# Patient Record
Sex: Male | Born: 1965 | Race: White | Hispanic: No | Smoking: Current some day smoker
Health system: Southern US, Community
[De-identification: ages and names within clinical notes are randomized; demographics above are authoritative.]

## PROBLEM LIST (undated history)

## (undated) DIAGNOSIS — M1990 Unspecified osteoarthritis, unspecified site: Secondary | ICD-10-CM

## (undated) HISTORY — PX: NECK SURGERY: SHX720

---

## 2011-01-30 ENCOUNTER — Emergency Department (HOSPITAL_COMMUNITY)
Admission: EM | Admit: 2011-01-30 | Discharge: 2011-01-30 | Disposition: A | Discharge status: Home / Self Care | Payer: Self-pay | Attending: Emergency Medicine | Admitting: Emergency Medicine

## 2011-01-30 DIAGNOSIS — K089 Disorder of teeth and supporting structures, unspecified: Secondary | ICD-10-CM | POA: Insufficient documentation

## 2020-01-13 ENCOUNTER — Emergency Department: Payer: Self-pay

## 2020-01-13 ENCOUNTER — Encounter: Payer: Self-pay | Admitting: Emergency Medicine

## 2020-01-13 ENCOUNTER — Other Ambulatory Visit: Payer: Self-pay

## 2020-01-13 ENCOUNTER — Emergency Department
Admission: EM | Admit: 2020-01-13 | Discharge: 2020-01-13 | Disposition: A | Discharge status: Home / Self Care | Payer: Self-pay | Attending: Emergency Medicine | Admitting: Emergency Medicine

## 2020-01-13 DIAGNOSIS — Z20822 Contact with and (suspected) exposure to covid-19: Secondary | ICD-10-CM | POA: Insufficient documentation

## 2020-01-13 DIAGNOSIS — Z79899 Other long term (current) drug therapy: Secondary | ICD-10-CM | POA: Insufficient documentation

## 2020-01-13 DIAGNOSIS — Z87891 Personal history of nicotine dependence: Secondary | ICD-10-CM | POA: Insufficient documentation

## 2020-01-13 DIAGNOSIS — J189 Pneumonia, unspecified organism: Secondary | ICD-10-CM | POA: Insufficient documentation

## 2020-01-13 HISTORY — DX: Unspecified osteoarthritis, unspecified site: M19.90

## 2020-01-13 LAB — BASIC METABOLIC PANEL
Anion gap: 10 (ref 5–15)
BUN: 10 mg/dL (ref 6–20)
CO2: 22 mmol/L (ref 22–32)
Calcium: 9 mg/dL (ref 8.9–10.3)
Chloride: 104 mmol/L (ref 98–111)
Creatinine, Ser: 0.99 mg/dL (ref 0.61–1.24)
GFR calc Af Amer: >60 mL/min (ref >60)
GFR calc non Af Amer: >60 mL/min (ref >60)
Glucose, Bld: 91 mg/dL (ref 70–99)
Potassium: 3.8 mmol/L (ref 3.5–5.1)
Sodium: 136 mmol/L (ref 135–145)

## 2020-01-13 LAB — CBC
HCT: 43.7 % (ref 39.0–52.0)
Hemoglobin: 14.9 g/dL (ref 13.0–17.0)
MCH: 30.2 pg (ref 26.0–34.0)
MCHC: 34.1 g/dL (ref 30.0–36.0)
MCV: 88.5 fL (ref 80.0–100.0)
Platelets: 286 10*3/uL (ref 150–400)
RBC: 4.94 MIL/uL (ref 4.22–5.81)
RDW: 13.2 % (ref 11.5–15.5)
WBC: 9.7 10*3/uL (ref 4.0–10.5)
nRBC: 0 % (ref 0.0–0.2)

## 2020-01-13 LAB — SARS CORONAVIRUS 2 (TAT 6-24 HRS): SARS Coronavirus 2: NEGATIVE

## 2020-01-13 LAB — TROPONIN I (HIGH SENSITIVITY)
Troponin I (High Sensitivity): <2 ng/L (ref <18)
Troponin I (High Sensitivity): 3 ng/L (ref <18)

## 2020-01-13 MED ORDER — SODIUM CHLORIDE 0.9% FLUSH: 3.0000 mL | Freq: Once | INTRAVENOUS | Status: DC

## 2020-01-13 MED ORDER — PREDNISONE 20 MG PO TABS: 40.0000 mg | ORAL_TABLET | Freq: Every day | ORAL | 0 refills | Status: DC

## 2020-01-13 MED ORDER — PREDNISONE 20 MG PO TABS
40.0000 mg | ORAL_TABLET | Freq: Once | ORAL | Status: AC
  Administered 2020-01-13: 40 mg via ORAL
  Filled 2020-01-13: qty 2

## 2020-01-13 MED ORDER — IOHEXOL 350 MG/ML SOLN
75.0000 mL | Freq: Once | INTRAVENOUS | Status: AC | PRN
  Administered 2020-01-13: 75 mL via INTRAVENOUS

## 2020-01-13 MED ORDER — AZITHROMYCIN 500 MG PO TABS
500.0000 mg | ORAL_TABLET | Freq: Once | ORAL | Status: AC
  Administered 2020-01-13: 500 mg via ORAL
  Filled 2020-01-13: qty 1

## 2020-01-13 MED ORDER — AZITHROMYCIN 250 MG PO TABS: ORAL_TABLET | ORAL | 0 refills | Status: DC

## 2020-01-13 NOTE — ED Triage Notes (Signed)
Says he has pain to right of sternum, burning with deep breaths and goes into coughing fits where he cant stop.  This has been happening for a month and no fevers.  Dry cough.

## 2020-01-13 NOTE — Discharge Instructions (Addendum)
As we discussed, there is some chance that you have been experiencing symptoms from COVID-19 and its been ongoing for about a month now.  However we are not sure yet.  We are sending a test today to help evaluate for this, until this test comes back negative please consider he of self possibly to have COVID-19 and to quarantine away from other people.  If your test becomes positive you will need to self quarantine for about 14 days.  Additionally, as we discussed, please establish a primary care doctor.  We recommend that you have a CT scan once again in about 1 year to evaluate for any new or increasing size and possible nodules in your lungs.

## 2020-01-13 NOTE — ED Provider Notes (Signed)
[facility] Emergency Department Provider Note   ____________________________________________   First MD Initiated Contact with Patient 01/13/20 1044     (approximate)  I have reviewed the triage vital signs and the nursing notes.   HISTORY  Chief Complaint Chest Pain and Cough    HPI Brent Pittman is a 54 y.o. male evaluation of cough any tingling burning right-sided chest discomfort is been present for about a month  He has been suffering symptoms of intermittently feeling like he will have a dry cough, slight wheezing off and on at times, a little bit of shortness of breath off and on, and intermittent burning chest discomfort that comes and goes.  He is a previous smoker.  Had been incarcerated until a few months ago when he was incarcerated for about 7 years.  Denies history of tuberculosis or known infectious exposure.  No known Covid exposure  Patient reports that he went to work today, but since his work was closed due to the weather he thought he would use the time to come get checked out.  Denies any acute worsening of symptoms over the last month.    Denies left-sided chest pain.  No radiating pain.  No abdominal pain.  No nausea or vomiting.  No sweating.  No history of coronary disease.  Reports only history is really that of neck arthritis  Past Medical History:  Diagnosis Date  . Arthritis     There are no problems to display for this patient.   Past Surgical History:  Procedure Laterality Date  . NECK SURGERY      Prior to Admission medications   Medication Sig Start Date End Date Taking? Authorizing Provider  gabapentin (NEURONTIN) 800 MG tablet Take 800 mg by mouth 2 (two) times daily.   Yes [provider]  ibuprofen (ADVIL) 800 MG tablet Take 800 mg by mouth every 8 (eight) hours as needed.   Yes [provider]  azithromycin (ZITHROMAX Z-PAK) 250 MG tablet 1 tab by mouth daily 01/13/20   Sharyn Creamer, MD    predniSONE (DELTASONE) 20 MG tablet Take 2 tablets (40 mg total) by mouth daily. 01/13/20   Sharyn Creamer, MD    Allergies Patient has no known allergies.  No family history on file.  Social History Social History   Tobacco Use  . Smoking status: Former Games developer  . Smokeless tobacco: Current User  Substance Use Topics  . Alcohol use: Yes  . Drug use: Not on file    Review of Systems Constitutional: No fever/chills Eyes: No visual changes. ENT: No sore throat. Cardiovascular: See HPI Respiratory: See HPI Gastrointestinal: No abdominal pain.   Genitourinary: Negative for dysuria. Musculoskeletal: Negative for back pain.  No leg swelling or weight gain. Skin: Negative for rash. Neurological: Negative for headaches, areas of focal weakness or numbness.    ____________________________________________   PHYSICAL EXAM:  VITAL SIGNS: ED Triage Vitals  Enc Vitals Group     BP 01/13/20 0945 (!) 163/87     Pulse Rate 01/13/20 0945 73     Resp 01/13/20 0945 18     Temp 01/13/20 0945 98.2 F (36.8 C)     Temp Source 01/13/20 0945 Oral     SpO2 01/13/20 0945 98 %     Weight 01/13/20 0944 205 lb (93 kg)     Height 01/13/20 0944 5\' 10"  (1.778 m)     Head Circumference --      Peak Flow --  Pain Score 01/13/20 0955 3     Pain Loc --      Pain Edu? --      Excl. in [place]? --     Constitutional: Alert and oriented. Well appearing and in no acute distress. Eyes: Conjunctivae are normal. Head: Atraumatic. Nose: No congestion/rhinnorhea. Mouth/Throat: Mucous membranes are moist. Neck: No stridor.  Cardiovascular: Normal rate, regular rhythm. Grossly normal heart sounds.  Good peripheral circulation. Respiratory: Normal respiratory effort.  No retractions. Lungs CTAB.  Currently patient reports is not having any wheezing but reports will come and go.  Not presently coughing. Gastrointestinal: Soft and nontender. No distention. Musculoskeletal: No lower extremity tenderness  nor edema. Neurologic:  Normal speech and language. No gross focal neurologic deficits are appreciated.  Skin:  Skin is warm, dry and intact. No rash noted. Psychiatric: Mood and affect are normal. Speech and behavior are normal.  ____________________________________________   LABS (all labs ordered are listed, but only abnormal results are displayed)  Labs Reviewed  SARS CORONAVIRUS 2 (TAT 6-24 HRS)  BASIC METABOLIC PANEL  CBC  TROPONIN I (HIGH SENSITIVITY)  TROPONIN I (HIGH SENSITIVITY)   ____________________________________________  EKG  Reviewed and interpreted by me ED ECG REPORT I, Delman Kitten, the attending physician, personally viewed and interpreted this ECG.  Date: 01/13/2020 EKG Time: 950 Rate: 70 Rhythm: normal sinus rhythm QRS Axis: normal Intervals: normal ST/T Wave abnormalities: normal Narrative Interpretation: no evidence of acute ischemia  ____________________________________________  RADIOLOGY  DG Chest 2 View  Result Date: 01/13/2020 CLINICAL DATA:  Cough and chest pain EXAM: CHEST - 2 VIEW COMPARISON:  None. FINDINGS: There is a somewhat nodular appearing area in the right upper lobe measuring 2.2 x 1.9 cm. There is an equivocal nodular opacity in the left upper lobe measuring 1.1 x 1.0 cm. Lungs elsewhere are clear. Heart size and pulmonary vascularity are normal. No adenopathy. There is postoperative change in the lower cervical region. IMPRESSION: Nodular opacity in each upper lobe, larger on the right than on the left. Advise noncontrast enhanced chest CT to further evaluate given these nodular opacities. Lungs elsewhere clear. Cardiac silhouette normal.  No adenopathy. Electronically Signed   By: Lowella Grip III M.D.   On: 01/13/2020 10:17   CT Angio Chest PE W and/or Wo Contrast  Result Date: 01/13/2020 CLINICAL DATA:  Cough and chest pain EXAM: CT ANGIOGRAPHY CHEST WITH CONTRAST TECHNIQUE: Multidetector CT imaging of the chest was performed  using the standard protocol during bolus administration of intravenous contrast. Multiplanar CT image reconstructions and MIPs were obtained to evaluate the vascular anatomy. CONTRAST:  31mL OMNIPAQUE IOHEXOL 350 MG/ML SOLN COMPARISON:  Chest radiograph January 13, 2020 FINDINGS: Cardiovascular: There is no demonstrable pulmonary embolus. There is no thoracic aortic aneurysm or dissection. Visualized great vessels appear unremarkable. There is no pericardial effusion or pericardial thickening. Mediastinum/Nodes: Visualized thyroid appears normal. There are occasional subcentimeter mediastinal lymph nodes. There is no evident adenopathy by size criteria on this study. No esophageal lesions are appreciable. Lungs/Pleura: There is widespread airspace opacity throughout the lungs with multiple areas of ground-glass type opacity present. The areas that appeared somewhat nodular on chest radiograph are less well-defined on CT and are felt to be due to pneumonia. On axial slice 43 series 6, there is a 3 mm nodular opacity in the medial segment right middle lobe. On axial slice 34 series 6, there is a 4 mm nodular opacity in the anterior segment of the right upper lobe. On axial slice  28 series 6, there is a 2 mm nodular opacity in the anterior segment of the right upper lobe. There are no evident pleural effusions. Upper Abdomen: There is a 1.1 x 1.0 cm probable cyst in the dome of the liver. Occasional 3-4 mm probable cysts also noted in the liver. Visualized upper abdominal structures otherwise appear unremarkable. Musculoskeletal: No blastic or lytic bone lesions. No chest wall lesions evident. Review of the MIP images confirms the above findings. IMPRESSION: 1. No demonstrable pulmonary embolus. No thoracic aortic aneurysm or dissection. 2. Multifocal pneumonia, likely of atypical organism etiology given the appearance. The nodular appearing areas on chest radiograph are ill-defined by CT and felt to be secondary to  pneumonia as opposed to focal mass lesions. 3. There are occasional 2-4 mm nodular opacities present. No follow-up needed if patient is low-risk (and has no known or suspected primary neoplasm). Non-contrast chest CT can be considered in 12 months if patient is high-risk. This recommendation follows the consensus statement: Guidelines for Management of Incidental Pulmonary Nodules Detected on CT Images: From the Fleischner Society 2017; Radiology 2017; 284:228-243. 4.  No evident adenopathy. Electronically Signed   By: Bretta Bang III M.D.   On: 01/13/2020 13:04   Discussed findings with patient, including recommendation for 28-month follow-up.  In possible nodules and possibility of pneumonia versus COVID-19 infection  ____________________________________________   PROCEDURES  Procedure(s) performed: None  Procedures  Critical Care performed: No  ____________________________________________   INITIAL IMPRESSION / ASSESSMENT AND PLAN / ED COURSE  Pertinent labs & imaging results that were available during my care of the patient were reviewed by me and considered in my medical decision making (see chart for details).   Patient presents for about 1 month of intermittent symptoms of wheezing associated with dry cough as well as burning sensation at times over the right side of chest.  Currently is work-up very reassuring.  His symptoms are not typical of ACS.  Risk factor would be that he is a smoker, but I am concerned based on his imaging studies of the chest x-ray that he needs further work-up.  Discussed with the patient, shared medical decision making given his goals of care we will proceed with obtaining a CT scan to further evaluate.  Concerned about his history of smoking, is also been previously incarcerated.  He lacks obvious acute infectious symptoms, afebrile, normal work of breathing, but does report some nonproductive cough for about a month.  Further evaluate, which to exclude  concerning etiology such as tumor, mass, and also given associated some dyspnea and exclude pulmonary embolism  Low risk ACS.  Very normal EKG.  Atypical history.  Normal troponin.  Clinical Course as of Jan 13 1328  Thu Jan 13, 2020  1216 Clinically, I do not believe the patient will be suffering from COVID-19.  Has had symptoms for over a month, not associated with fevers chills body aches. At present, I don't believe COVID PCR testing would be beneficial given longevity of symptoms and he is not immunocompromised.    [MQ]    Clinical Course User Index [MQ] Sharyn Creamer, MD   ----------------------------------------- 1:29 PM on 01/13/2020 -----------------------------------------  In lieu of the patient's CT findings, we will send a COVID-19 test.  His symptoms have been ongoing now for about a month but in further history taking reports it started out with what felt just like a sinus infection or allergies that developed into this count of long-lasting cough.  We will treat  also with steroids and azithromycin event of atypical pneumonia.  He is awake alert hemodynamically stable and in no distress.  Discussed recommendations for quarantine and self isolation at least until he has a negative COVID-19 test.  Interestingly, though his symptoms ongoing for about a month now it is hard to know if he would even be necessarily infectious but we will treat him as though he could be as they have never fully resolved.  Treat with azithromycin as well for possible atypicals.  Recommended establishing a primary care doctor as well.  Return precautions and treatment recommendations and follow-up discussed with the patient who is agreeable with the plan.   ____________________________________________   FINAL CLINICAL IMPRESSION(S) / ED DIAGNOSES  Final diagnoses:  Atypical pneumonia  Suspected COVID-19 virus infection        Note:  This document was prepared using Dragon voice recognition  software and may include unintentional dictation errors       Sharyn Creamer, MD 01/13/20 1330

## 2020-02-09 ENCOUNTER — Encounter: Payer: Self-pay | Admitting: Emergency Medicine

## 2020-02-09 ENCOUNTER — Other Ambulatory Visit: Payer: Self-pay

## 2020-02-09 ENCOUNTER — Emergency Department: Payer: Worker's Compensation

## 2020-02-09 ENCOUNTER — Emergency Department
Admission: EM | Admit: 2020-02-09 | Discharge: 2020-02-09 | Disposition: A | Discharge status: Home / Self Care | Payer: Worker's Compensation | Attending: Emergency Medicine | Admitting: Emergency Medicine

## 2020-02-09 DIAGNOSIS — W231XXA Caught, crushed, jammed, or pinched between stationary objects, initial encounter: Secondary | ICD-10-CM | POA: Diagnosis not present

## 2020-02-09 DIAGNOSIS — Y990 Civilian activity done for income or pay: Secondary | ICD-10-CM | POA: Diagnosis not present

## 2020-02-09 DIAGNOSIS — S6992XA Unspecified injury of left wrist, hand and finger(s), initial encounter: Secondary | ICD-10-CM | POA: Diagnosis present

## 2020-02-09 DIAGNOSIS — F17210 Nicotine dependence, cigarettes, uncomplicated: Secondary | ICD-10-CM | POA: Insufficient documentation

## 2020-02-09 DIAGNOSIS — Y929 Unspecified place or not applicable: Secondary | ICD-10-CM | POA: Insufficient documentation

## 2020-02-09 DIAGNOSIS — S61311A Laceration without foreign body of left index finger with damage to nail, initial encounter: Secondary | ICD-10-CM | POA: Insufficient documentation

## 2020-02-09 DIAGNOSIS — S62631B Displaced fracture of distal phalanx of left index finger, initial encounter for open fracture: Secondary | ICD-10-CM | POA: Diagnosis not present

## 2020-02-09 DIAGNOSIS — Y939 Activity, unspecified: Secondary | ICD-10-CM | POA: Insufficient documentation

## 2020-02-09 DIAGNOSIS — Z79899 Other long term (current) drug therapy: Secondary | ICD-10-CM | POA: Insufficient documentation

## 2020-02-09 DIAGNOSIS — S61309A Unspecified open wound of unspecified finger with damage to nail, initial encounter: Secondary | ICD-10-CM

## 2020-02-09 MED ORDER — HYDROCODONE-ACETAMINOPHEN 5-325 MG PO TABS: 1.0000 | ORAL_TABLET | Freq: Four times a day (QID) | ORAL | 0 refills | Status: DC | PRN

## 2020-02-09 MED ORDER — LIDOCAINE HCL (PF) 1 % IJ SOLN
5.0000 mL | Freq: Once | INTRAMUSCULAR | Status: AC
  Administered 2020-02-09: 5 mL via INTRADERMAL
  Filled 2020-02-09: qty 5

## 2020-02-09 MED ORDER — CEPHALEXIN 500 MG PO CAPS: 500.0000 mg | ORAL_CAPSULE | Freq: Three times a day (TID) | ORAL | 0 refills | Status: DC

## 2020-02-09 MED ORDER — CEFTRIAXONE SODIUM 1 G IJ SOLR
1.0000 g | Freq: Once | INTRAMUSCULAR | Status: AC
  Administered 2020-02-09: 1 g via INTRAMUSCULAR
  Filled 2020-02-09: qty 10

## 2020-02-09 NOTE — ED Provider Notes (Signed)
[facility] Emergency Department Provider Note  ____________________________________________   First MD Initiated Contact with Patient 02/09/20 1230     (approximate)  I have reviewed the triage vital signs and the nursing notes.   HISTORY  Chief Complaint Laceration    HPI Brent Pittman is a 54 y.o. male presents to the emergency department complaining of left index finger injury while at work.  Patient states his finger got caught between a rotor in a wheel.  Tdap is up-to-date.  No other injuries reported    Past Medical History:  Diagnosis Date  . Arthritis     There are no problems to display for this patient.   Past Surgical History:  Procedure Laterality Date  . NECK SURGERY      Prior to Admission medications   Medication Sig Start Date End Date Taking? Authorizing Provider  azithromycin (ZITHROMAX Z-PAK) 250 MG tablet 1 tab by mouth daily 01/13/20   Sharyn Creamer, MD  cephALEXin (KEFLEX) 500 MG capsule Take 1 capsule (500 mg total) by mouth 3 (three) times daily. 02/09/20   Yavuz Kirby, Roselyn Bering, PA-C  gabapentin (NEURONTIN) 800 MG tablet Take 800 mg by mouth 2 (two) times daily.    [provider]  HYDROcodone-acetaminophen (NORCO/VICODIN) 5-325 MG tablet Take 1 tablet by mouth every 6 (six) hours as needed for moderate pain. 02/09/20   Augustine Leverette, Roselyn Bering, PA-C  ibuprofen (ADVIL) 800 MG tablet Take 800 mg by mouth every 8 (eight) hours as needed.    [provider]  predniSONE (DELTASONE) 20 MG tablet Take 2 tablets (40 mg total) by mouth daily. 01/13/20   Sharyn Creamer, MD    Allergies Patient has no known allergies.  No family history on file.  Social History Social History   Tobacco Use  . Smoking status: Current Some Day Smoker    Types: Cigarettes  . Smokeless tobacco: Current User  Substance Use Topics  . Alcohol use: Yes  . Drug use: Never    Review of Systems  Constitutional: No fever/chills Eyes: No visual  changes. ENT: No sore throat. Respiratory: Denies cough Cardiovascular: Denies chest pain Gastrointestinal: Denies abdominal pain Genitourinary: Negative for dysuria. Musculoskeletal: Negative for back pain.  Left index finger injury Skin: Negative for rash.  Positive laceration Psychiatric: no mood changes,     ____________________________________________   PHYSICAL EXAM:  VITAL SIGNS: ED Triage Vitals  Enc Vitals Group     BP 02/09/20 1223 (!) 147/79     Pulse Rate 02/09/20 1223 67     Resp 02/09/20 1223 16     Temp 02/09/20 1223 97.8 F (36.6 C)     Temp Source 02/09/20 1223 Oral     SpO2 02/09/20 1223 100 %     Weight 02/09/20 1217 200 lb (90.7 kg)     Height 02/09/20 1217 5\' 10"  (1.778 m)     Head Circumference --      Peak Flow --      Pain Score 02/09/20 1217 5     Pain Loc --      Pain Edu? --      Excl. in [place]? --     Constitutional: Alert and oriented. Well appearing and in no acute distress. Eyes: Conjunctivae are normal.  Head: Atraumatic. Nose: No congestion/rhinnorhea.  Cardiovascular: Normal rate, regular rhythm.  Respiratory: Normal respiratory effort.  No retractions, GU: deferred Musculoskeletal: Left index finger with a crush-like injury noted, nail partial avulsion with laceration below the  nail, area is very tender, neurovascular is intact  neurologic:  Normal speech and language.  Skin:  Skin is warm, dry  No rash noted. Psychiatric: Mood and affect are normal. Speech and behavior are normal.  ____________________________________________   LABS (all labs ordered are listed, but only abnormal results are displayed)  Labs Reviewed - No data to display ____________________________________________   ____________________________________________  RADIOLOGY  X-ray of the left index finger shows a comminuted displaced distal tuft fracture  ____________________________________________   PROCEDURES  Procedure(s) performed:    Marland KitchenMarland KitchenLaceration Repair  Date/Time: 02/09/2020 2:48 PM Performed by: Versie Starks, PA-C Authorized by: Versie Starks, PA-C   Consent:    Consent obtained:  Verbal   Consent given by:  Patient   Risks discussed:  Infection, pain, retained foreign body, tendon damage, poor cosmetic result, need for additional repair, nerve damage, poor wound healing and vascular damage Anesthesia (see MAR for exact dosages):    Anesthesia method:  Nerve block   Block needle gauge:  27 G   Block anesthetic:  Lidocaine 1% w/o epi   Block injection procedure:  Anatomic landmarks identified, introduced needle, incremental injection, anatomic landmarks palpated and negative aspiration for blood   Block outcome:  Anesthesia achieved Laceration details:    Location:  Finger   Length (cm):  4 Repair type:    Repair type:  Simple Pre-procedure details:    Preparation:  Patient was prepped and draped in usual sterile fashion Exploration:    Hemostasis achieved with:  Direct pressure   Wound exploration: wound explored through full range of motion     Wound extent: underlying fracture     Wound extent: no foreign bodies/material noted and no tendon damage noted   Treatment:    Area cleansed with:  Betadine and saline   Amount of cleaning:  Standard   Irrigation solution:  Sterile saline   Irrigation method:  Syringe and tap Skin repair:    Repair method:  Sutures   Suture size:  5-0   Suture material:  Nylon   Suture technique:  Simple interrupted   Number of sutures:  8 Approximation:    Approximation:  Close Post-procedure details:    Dressing:  Non-adherent dressing and splint for protection   Patient tolerance of procedure:  Tolerated well, no immediate complications      ____________________________________________   INITIAL IMPRESSION / ASSESSMENT AND PLAN / ED COURSE  Pertinent labs & imaging results that were available during my care of the patient were reviewed by me and  considered in my medical decision making (see chart for details).   Patient is a 54 year old male presents emergency department with concerns of laceration and left index finger injury while at work.  See HPI  Physical exam shows patient to appear well.  Vitals normal.  Left index finger has laceration and crush-like injury.  Partial nail avulsion.  X-ray left index finger shows comminuted displaced distal tuft fracture  Due to the skin being open along with the fracture we consider this an open fracture.  Patient be given Rocephin 1 g IM.  See procedure note for repair.  Nonstick dressing and finger splint were applied by nursing staff.  Patient was given instructions to keep the finger as dry as possible.  Follow-up with a hand specialist.  Dr. Jackqulyn Livings was recommended.  He is to also talk to his Worker's Comp. provider to make sure they will pay for this provider.  Therefore if they do not they  should refer him to a hand specialist.  He was given a prescription for Keflex and Vicodin.  He was given work limitations stating that he should not use the left hand until released by orthopedics.  Should not operate heavy machinery if taking his pain medication.  UDS was obtained by nursing staff.  Is discharged stable condition.    Brent Pittman was evaluated in Emergency Department on 02/09/2020 for the symptoms described in the history of present illness. He was evaluated in the context of the global COVID-19 pandemic, which necessitated consideration that the patient might be at risk for infection with the SARS-CoV-2 virus that causes COVID-19. Institutional protocols and algorithms that pertain to the evaluation of patients at risk for COVID-19 are in a state of rapid change based on information released by regulatory bodies including the CDC and federal and state organizations. These policies and algorithms were followed during the patient's care in the ED.   As part of my medical decision  making, I reviewed the following data within the electronic MEDICAL RECORD NUMBER Nursing notes reviewed and incorporated, Old chart reviewed, Radiograph reviewed , Notes from prior ED visits and [place] Controlled Substance Database  ____________________________________________   FINAL CLINICAL IMPRESSION(S) / ED DIAGNOSES  Final diagnoses:  Open displaced fracture of distal phalanx of left index finger, initial encounter  Nail avulsion, finger, initial encounter      NEW MEDICATIONS STARTED DURING THIS VISIT:  New Prescriptions   CEPHALEXIN (KEFLEX) 500 MG CAPSULE    Take 1 capsule (500 mg total) by mouth 3 (three) times daily.   HYDROCODONE-ACETAMINOPHEN (NORCO/VICODIN) 5-325 MG TABLET    Take 1 tablet by mouth every 6 (six) hours as needed for moderate pain.     Note:  This document was prepared using Dragon voice recognition software and may include unintentional dictation errors.    Faythe Ghee, PA-C 02/09/20 1453    Chesley Noon, MD 02/09/20 1524

## 2020-02-09 NOTE — ED Notes (Addendum)
See triage note. Pt ambulatory to room. Pt left index finger wrapped and bleeding controlled. Pt with circumferential laceration around top of nail. Pt is workers Occupational hygienist.

## 2020-02-09 NOTE — ED Notes (Signed)
Workers comp information walked to lab by this [employer]

## 2020-02-09 NOTE — ED Triage Notes (Signed)
Pt in via POV from work, reports finger becoming lodged between wheel and roder with laceration to tip of index finger.  Dry dressing in place, bleeding controlled at this time.  NAD noted.

## 2020-02-09 NOTE — Discharge Instructions (Signed)
Follow-up with [facility] with Dr. Stephenie Acres.  If Worker's Comp. does not approve of this physician then have them refer you to a hand specialist.  Keep the area as dry as possible.  Take your medication as prescribed.  Elevate and ice if the area is throbbing.

## 2020-03-31 ENCOUNTER — Encounter: Payer: Self-pay | Admitting: Emergency Medicine

## 2020-03-31 ENCOUNTER — Emergency Department: Payer: 59

## 2020-03-31 ENCOUNTER — Emergency Department
Admission: EM | Admit: 2020-03-31 | Discharge: 2020-03-31 | Disposition: A | Discharge status: Home / Self Care | Payer: 59 | Attending: Emergency Medicine | Admitting: Emergency Medicine

## 2020-03-31 DIAGNOSIS — Z79899 Other long term (current) drug therapy: Secondary | ICD-10-CM | POA: Insufficient documentation

## 2020-03-31 DIAGNOSIS — F17210 Nicotine dependence, cigarettes, uncomplicated: Secondary | ICD-10-CM | POA: Insufficient documentation

## 2020-03-31 DIAGNOSIS — J189 Pneumonia, unspecified organism: Secondary | ICD-10-CM | POA: Insufficient documentation

## 2020-03-31 DIAGNOSIS — R0602 Shortness of breath: Secondary | ICD-10-CM | POA: Diagnosis present

## 2020-03-31 LAB — BASIC METABOLIC PANEL
Anion gap: 10 (ref 5–15)
BUN: 14 mg/dL (ref 6–20)
CO2: 23 mmol/L (ref 22–32)
Calcium: 8.6 mg/dL — ABNORMAL LOW (ref 8.9–10.3)
Chloride: 102 mmol/L (ref 98–111)
Creatinine, Ser: 1.08 mg/dL (ref 0.61–1.24)
GFR calc Af Amer: >60 mL/min (ref >60)
GFR calc non Af Amer: >60 mL/min (ref >60)
Glucose, Bld: 105 mg/dL — ABNORMAL HIGH (ref 70–99)
Potassium: 3.9 mmol/L (ref 3.5–5.1)
Sodium: 135 mmol/L (ref 135–145)

## 2020-03-31 LAB — CBC WITH DIFFERENTIAL/PLATELET
Abs Immature Granulocytes: 0.04 10*3/uL (ref 0.00–0.07)
Basophils Absolute: 0.1 10*3/uL (ref 0.0–0.1)
Basophils Relative: 1 %
Eosinophils Absolute: 0.3 10*3/uL (ref 0.0–0.5)
Eosinophils Relative: 3 %
HCT: 43.7 % (ref 39.0–52.0)
Hemoglobin: 14.8 g/dL (ref 13.0–17.0)
Immature Granulocytes: 0 %
Lymphocytes Relative: 13 %
Lymphs Abs: 1.4 10*3/uL (ref 0.7–4.0)
MCH: 30 pg (ref 26.0–34.0)
MCHC: 33.9 g/dL (ref 30.0–36.0)
MCV: 88.6 fL (ref 80.0–100.0)
Monocytes Absolute: 1.4 10*3/uL — ABNORMAL HIGH (ref 0.1–1.0)
Monocytes Relative: 14 %
Neutro Abs: 7.3 10*3/uL (ref 1.7–7.7)
Neutrophils Relative %: 69 %
Platelets: 190 10*3/uL (ref 150–400)
RBC: 4.93 MIL/uL (ref 4.22–5.81)
RDW: 13.5 % (ref 11.5–15.5)
WBC: 10.6 10*3/uL — ABNORMAL HIGH (ref 4.0–10.5)
nRBC: 0 % (ref 0.0–0.2)

## 2020-03-31 MED ORDER — CEFTRIAXONE SODIUM 1 G IJ SOLR
1.0000 g | Freq: Once | INTRAMUSCULAR | Status: AC
  Administered 2020-03-31: 1 g via INTRAMUSCULAR
  Filled 2020-03-31: qty 10

## 2020-03-31 MED ORDER — ALBUTEROL SULFATE (2.5 MG/3ML) 0.083% IN NEBU: 5.0000 mg | INHALATION_SOLUTION | Freq: Once | RESPIRATORY_TRACT | Status: DC

## 2020-03-31 MED ORDER — AZITHROMYCIN 250 MG PO TABS
ORAL_TABLET | ORAL | 0 refills | Status: DC
Start: 2020-03-31 — End: 2022-12-19

## 2020-03-31 MED ORDER — AZITHROMYCIN 500 MG PO TABS
500.0000 mg | ORAL_TABLET | Freq: Once | ORAL | Status: AC
  Administered 2020-03-31: 18:00:00 500 mg via ORAL
  Filled 2020-03-31: qty 1

## 2020-03-31 MED ORDER — PSEUDOEPH-BROMPHEN-DM 30-2-10 MG/5ML PO SYRP
10.0000 mL | ORAL_SOLUTION | Freq: Four times a day (QID) | ORAL | 0 refills | Status: DC | PRN
Start: 2020-03-31 — End: 2022-12-19

## 2020-03-31 NOTE — ED Provider Notes (Signed)
[facility] Emergency Department Provider Note  ____________________________________________  Time seen: Approximately 5:31 PM  I have reviewed the triage vital signs and the nursing notes.   HISTORY  Chief Complaint Shortness of Breath    HPI Brent Pittman is a 54 y.o. male who presents the emergency department complaining of cough, chest congestion.  Patient states that he had been diagnosed with pneumonia 3 months ago.  Patient states that he improved with prescriptions.  Patient developed similar symptoms over the past week and was concerned that he may have pneumonia again.  Patient denies any fevers or chills, nasal congestion, sore throat.  No difficulty breathing.  No chest pain.  No abdominal pain, nausea vomiting or diarrhea.         Past Medical History:  Diagnosis Date  . Arthritis     There are no problems to display for this patient.   Past Surgical History:  Procedure Laterality Date  . NECK SURGERY      Prior to Admission medications   Medication Sig Start Date End Date Taking? Authorizing Provider  azithromycin (ZITHROMAX Z-PAK) 250 MG tablet Take 2 tablets (500 mg) on  Day 1,  followed by 1 tablet (250 mg) once daily on Days 2 through 5. 03/31/20   Gamble Enderle, Christiane Ha D, PA-C  brompheniramine-pseudoephedrine-DM 30-2-10 MG/5ML syrup Take 10 mLs by mouth 4 (four) times daily as needed. 03/31/20   Shashwat Cleary, Delorise Royals, PA-C  cephALEXin (KEFLEX) 500 MG capsule Take 1 capsule (500 mg total) by mouth 3 (three) times daily. 02/09/20   Fisher, Roselyn Bering, PA-C  gabapentin (NEURONTIN) 800 MG tablet Take 800 mg by mouth 2 (two) times daily.    [provider]  HYDROcodone-acetaminophen (NORCO/VICODIN) 5-325 MG tablet Take 1 tablet by mouth every 6 (six) hours as needed for moderate pain. 02/09/20   Fisher, Roselyn Bering, PA-C  ibuprofen (ADVIL) 800 MG tablet Take 800 mg by mouth every 8 (eight) hours as needed.    [provider]   predniSONE (DELTASONE) 20 MG tablet Take 2 tablets (40 mg total) by mouth daily. 01/13/20   Sharyn Creamer, MD    Allergies Patient has no known allergies.  History reviewed. No pertinent family history.  Social History Social History   Tobacco Use  . Smoking status: Current Some Day Smoker    Types: Cigarettes  . Smokeless tobacco: Current User  Substance Use Topics  . Alcohol use: Yes  . Drug use: Never     Review of Systems  Constitutional: No fever/chills Eyes: No visual changes. No discharge ENT: No upper respiratory complaints. Cardiovascular: no chest pain. Respiratory: Positive for productive cough. No SOB. Gastrointestinal: No abdominal pain.  No nausea, no vomiting.  No diarrhea.  No constipation. Musculoskeletal: Negative for musculoskeletal pain. Skin: Negative for rash, abrasions, lacerations, ecchymosis. Neurological: Negative for headaches, focal weakness or numbness. 10-point ROS otherwise negative.  ____________________________________________   PHYSICAL EXAM:  VITAL SIGNS: ED Triage Vitals  Enc Vitals Group     BP 03/31/20 1554 (!) 150/77     Pulse Rate 03/31/20 1554 96     Resp 03/31/20 1554 16     Temp 03/31/20 1554 98.6 F (37 C)     Temp Source 03/31/20 1554 Oral     SpO2 03/31/20 1554 97 %     Weight 03/31/20 1555 200 lb (90.7 kg)     Height 03/31/20 1555 5\' 10"  (1.778 m)     Head Circumference --  Peak Flow --      Pain Score 03/31/20 1558 0     Pain Loc --      Pain Edu? --      Excl. in GC? --      Constitutional: Alert and oriented. Well appearing and in no acute distress. Eyes: Conjunctivae are normal. PERRL. EOMI. Head: Atraumatic. ENT:      Ears:       Nose: No congestion/rhinnorhea.      Mouth/Throat: Mucous membranes are moist.  Neck: No stridor.   Hematological/Lymphatic/Immunilogical: No cervical lymphadenopathy. Cardiovascular: Normal rate, regular rhythm. Normal S1 and S2.  Good peripheral  circulation. Respiratory: Normal respiratory effort without tachypnea or retractions. Lungs CTAB with no frank adventitious lung sounds.Peri Jefferson air entry to the bases with no decreased or absent breath sounds. Musculoskeletal: Full range of motion to all extremities. No gross deformities appreciated. Neurologic:  Normal speech and language. No gross focal neurologic deficits are appreciated.  Skin:  Skin is warm, dry and intact. No rash noted. Psychiatric: Mood and affect are normal. Speech and behavior are normal. Patient exhibits appropriate insight and judgement.   ____________________________________________   LABS (all labs ordered are listed, but only abnormal results are displayed)  Labs Reviewed  CBC WITH DIFFERENTIAL/PLATELET - Abnormal; Notable for the following components:      Result Value   WBC 10.6 (*)    Monocytes Absolute 1.4 (*)    All other components within normal limits  BASIC METABOLIC PANEL - Abnormal; Notable for the following components:   Glucose, Bld 105 (*)    Calcium 8.6 (*)    All other components within normal limits   ____________________________________________  EKG   ____________________________________________  RADIOLOGY I personally viewed and evaluated these images as part of my medical decision making, as well as reviewing the written report by the radiologist.  DG Chest 2 View  Result Date: 03/31/2020 CLINICAL DATA:  Pt states sob and chest pressure for 5 days. States diagnosed with pneumonia a month ago and having the same symptoms. No other hx of pneumothorax conditions or surgery. Current smoker. EXAM: CHEST - 2 VIEW COMPARISON:  01/13/2020 FINDINGS: Cardiac silhouette is normal in size. No mediastinal or hilar masses. No evidence of adenopathy. There hazy bilateral airspace lung opacities most evident on the left, which are similar to the prior exams. No evidence of pulmonary edema. No pleural effusion or pneumothorax. Skeletal structures  intact. IMPRESSION: 1. Subtle bilateral hazy airspace lung opacities, which are similar to the prior exams. Findings consistent multifocal pneumonia in the proper clinical setting. The opacities could be inflammatory. No evidence of pulmonary edema. Electronically Signed   By: Amie Portland M.D.   On: 03/31/2020 16:19    ____________________________________________    PROCEDURES  Procedure(s) performed:    Procedures    Medications  albuterol (PROVENTIL) (2.5 MG/3ML) 0.083% nebulizer solution 5 mg (has no administration in time range)  cefTRIAXone (ROCEPHIN) injection 1 g (has no administration in time range)  azithromycin (ZITHROMAX) tablet 500 mg (has no administration in time range)     ____________________________________________   INITIAL IMPRESSION / ASSESSMENT AND PLAN / ED COURSE  Pertinent labs & imaging results that were available during my care of the patient were reviewed by me and considered in my medical decision making (see chart for details).  Review of the [place] CSRS was performed in accordance of the [facility] prior to dispensing any controlled drugs.  Patient's diagnosis is consistent with community-acquired pneumonia.  Patient presented to the emergency department complaining of cough and chest congestion.  Patient had been diagnosed with pneumonia earlier this year.  States that symptoms were similar.  Overall exam was reassuring.  I have a low suspicion for Covid or URI.  X-ray revealed consolidations concerning for pneumonia.  Patient will be treated with Rocephin and Zithromax. Patient will be discharged home with prescriptions for azithromycin. Patient is to follow up with primary care as needed or otherwise directed. Patient is given ED precautions to return to the ED for any worsening or new symptoms.     ____________________________________________  FINAL CLINICAL IMPRESSION(S) / ED DIAGNOSES  Final diagnoses:  Community acquired pneumonia,  unspecified laterality      NEW MEDICATIONS STARTED DURING THIS VISIT:  ED Discharge Orders         Ordered    azithromycin (ZITHROMAX Z-PAK) 250 MG tablet     03/31/20 1733    brompheniramine-pseudoephedrine-DM 30-2-10 MG/5ML syrup  4 times daily PRN     03/31/20 1733              This chart was dictated using voice recognition software/Dragon. Despite best efforts to proofread, errors can occur which can change the meaning. Any change was purely unintentional.    Darletta Moll, PA-C 03/31/20 1734    Carrie Mew, MD 03/31/20 2303

## 2020-03-31 NOTE — ED Triage Notes (Signed)
Pt to ED with c/o of SOB. Pt dx with pneumonia in Feb of this year and states symptoms are very similar. Pt denies chest pain.

## 2020-04-04 IMAGING — DX DG FINGER INDEX 2+V*R*
3 series · 3 of 3 positions shown · non-contrast
Comparison: None.

CLINICAL DATA: Right finger pain after injury.

EXAM:
RIGHT INDEX FINGER 2+V

[finger ap]
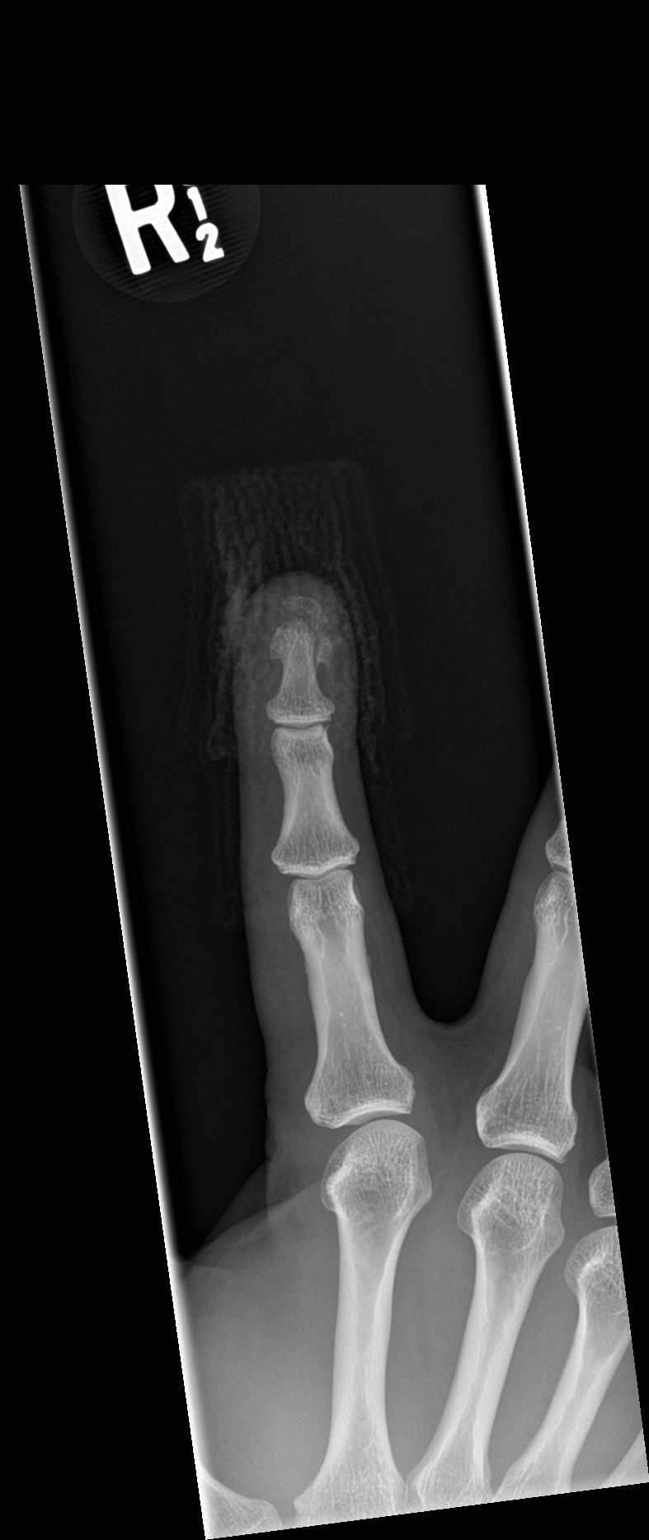

[finger obl]
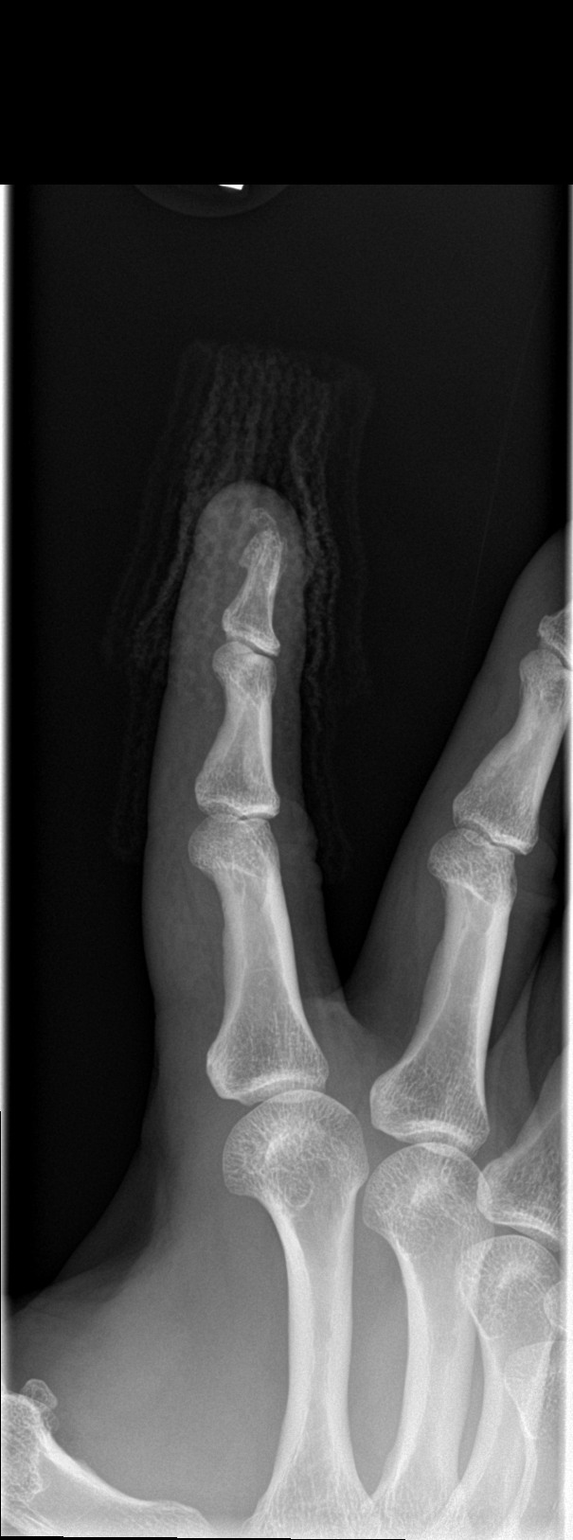

[finger lat]
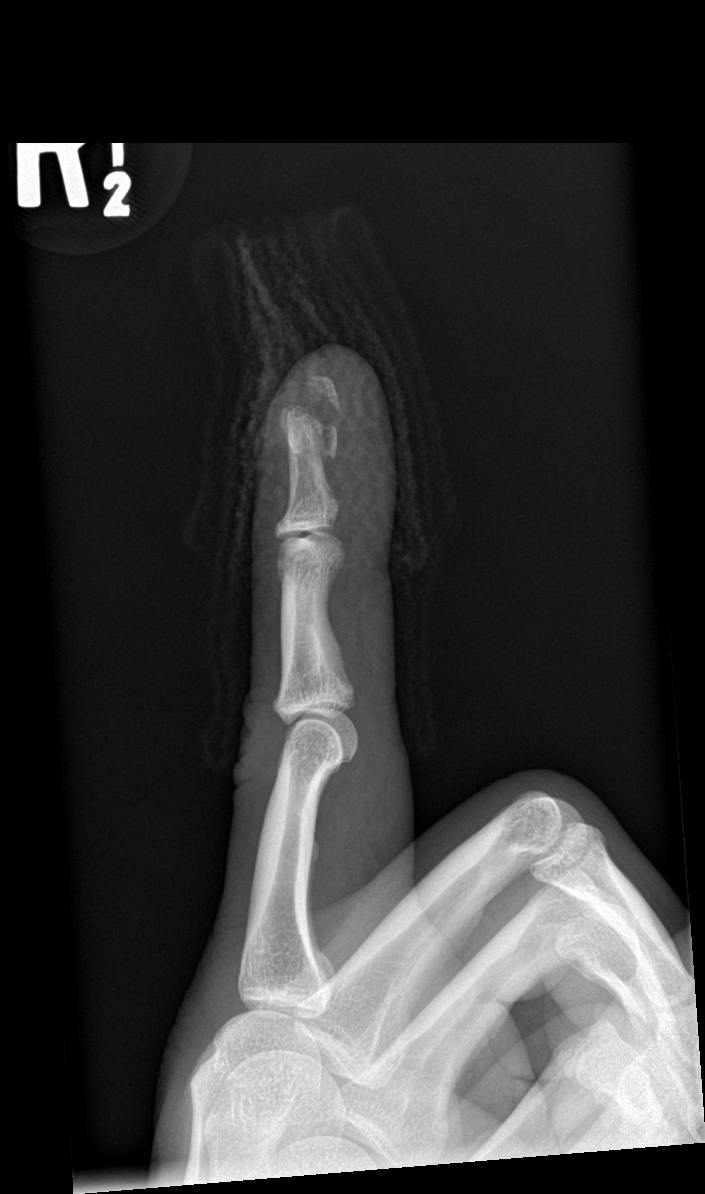

[3 of 3 positions shown; findings below may reference images not displayed]

FINDINGS: Moderately displaced and comminuted fracture is seen involving the
distal tuft of the second distal phalanx. No radiopaque foreign body
is noted. Joint spaces are unremarkable.
IMPRESSION: Moderately displaced and comminuted distal tuft fracture of second
distal phalanx.

## 2020-05-25 IMAGING — CR DG CHEST 2V
1 series · 2 of 2 positions shown · non-contrast
Comparison: 01/13/2020

CLINICAL DATA: Pt states sob and chest pressure for 5 days. States
diagnosed with pneumonia a month ago and having the same symptoms.
No other hx of pneumothorax conditions or surgery. Current smoker.

EXAM:
CHEST - 2 VIEW

[Series 1: dg chest 2 view · 0.14mm/px · 2 of 2 slices shown]
[im 1/2]
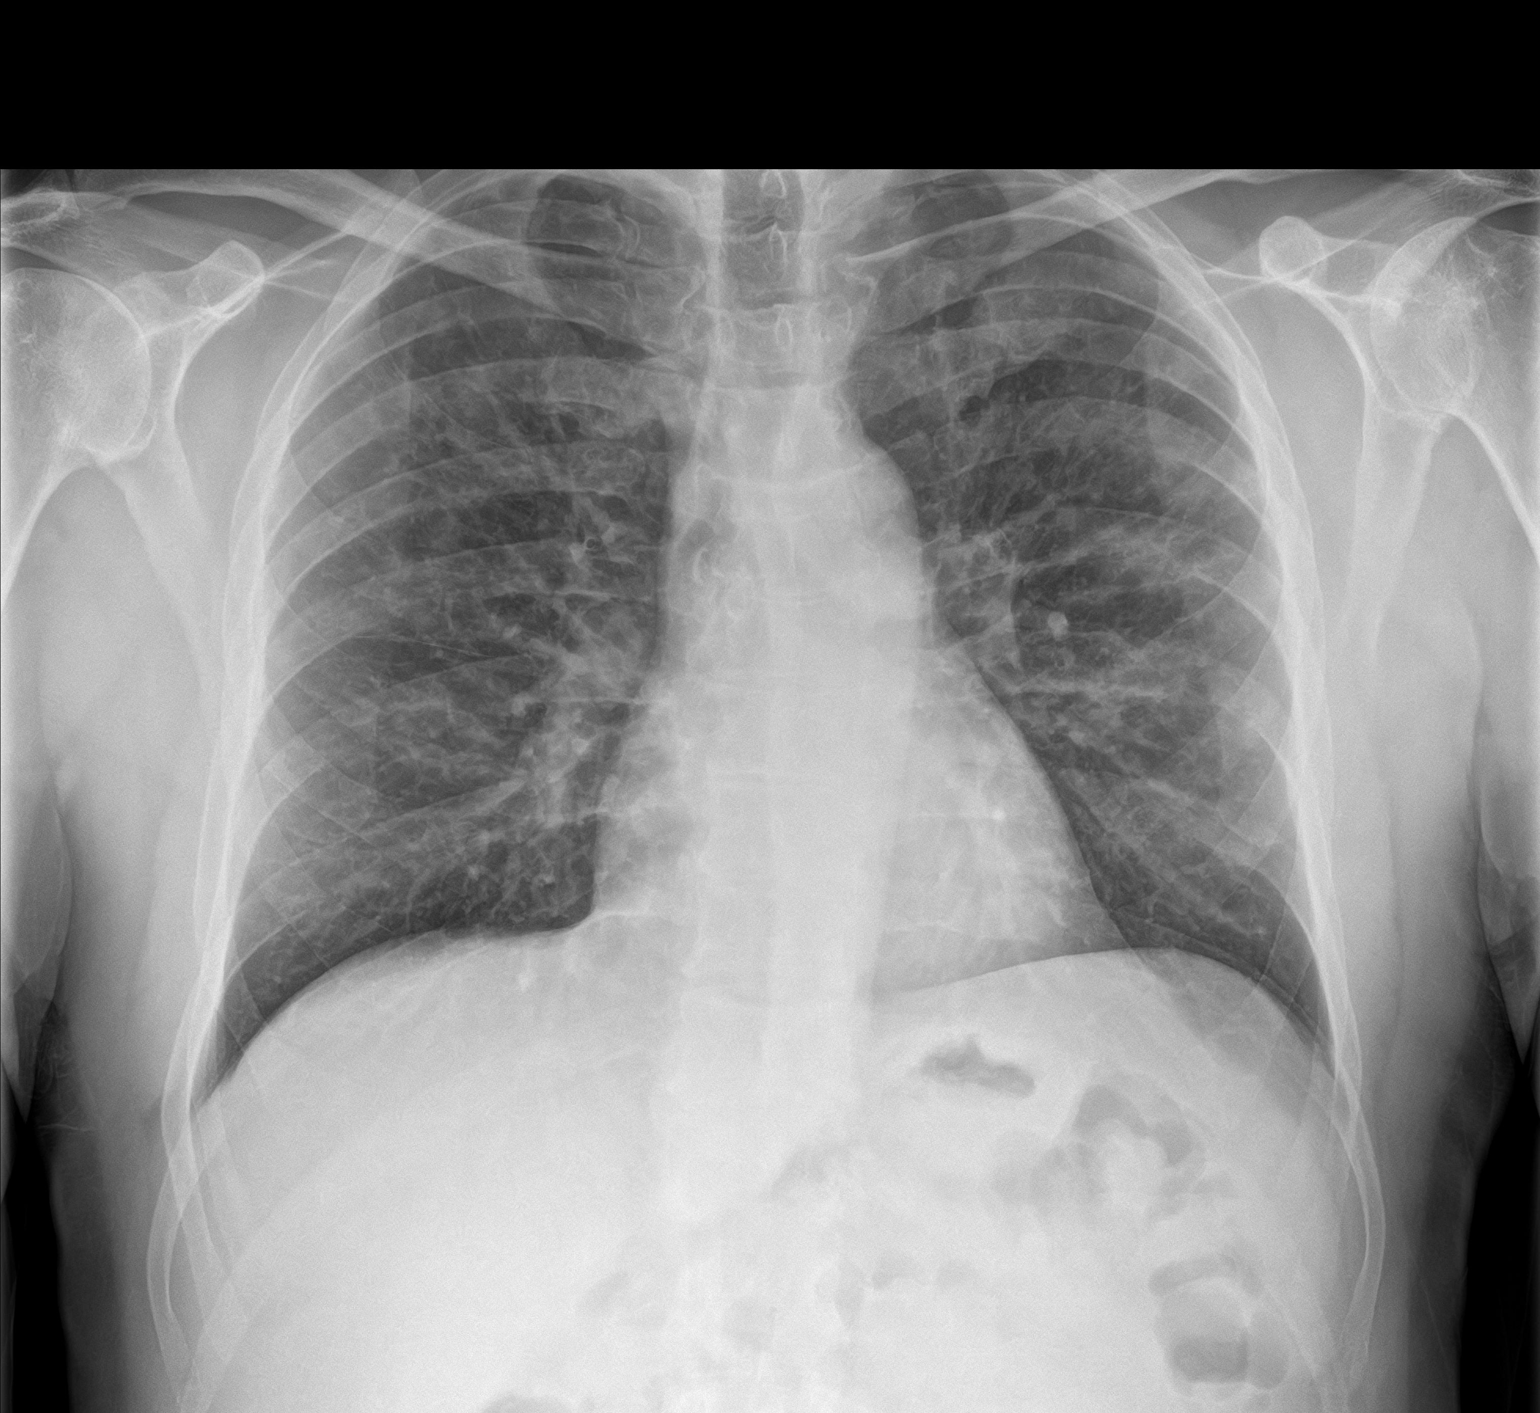
[im 2/2]
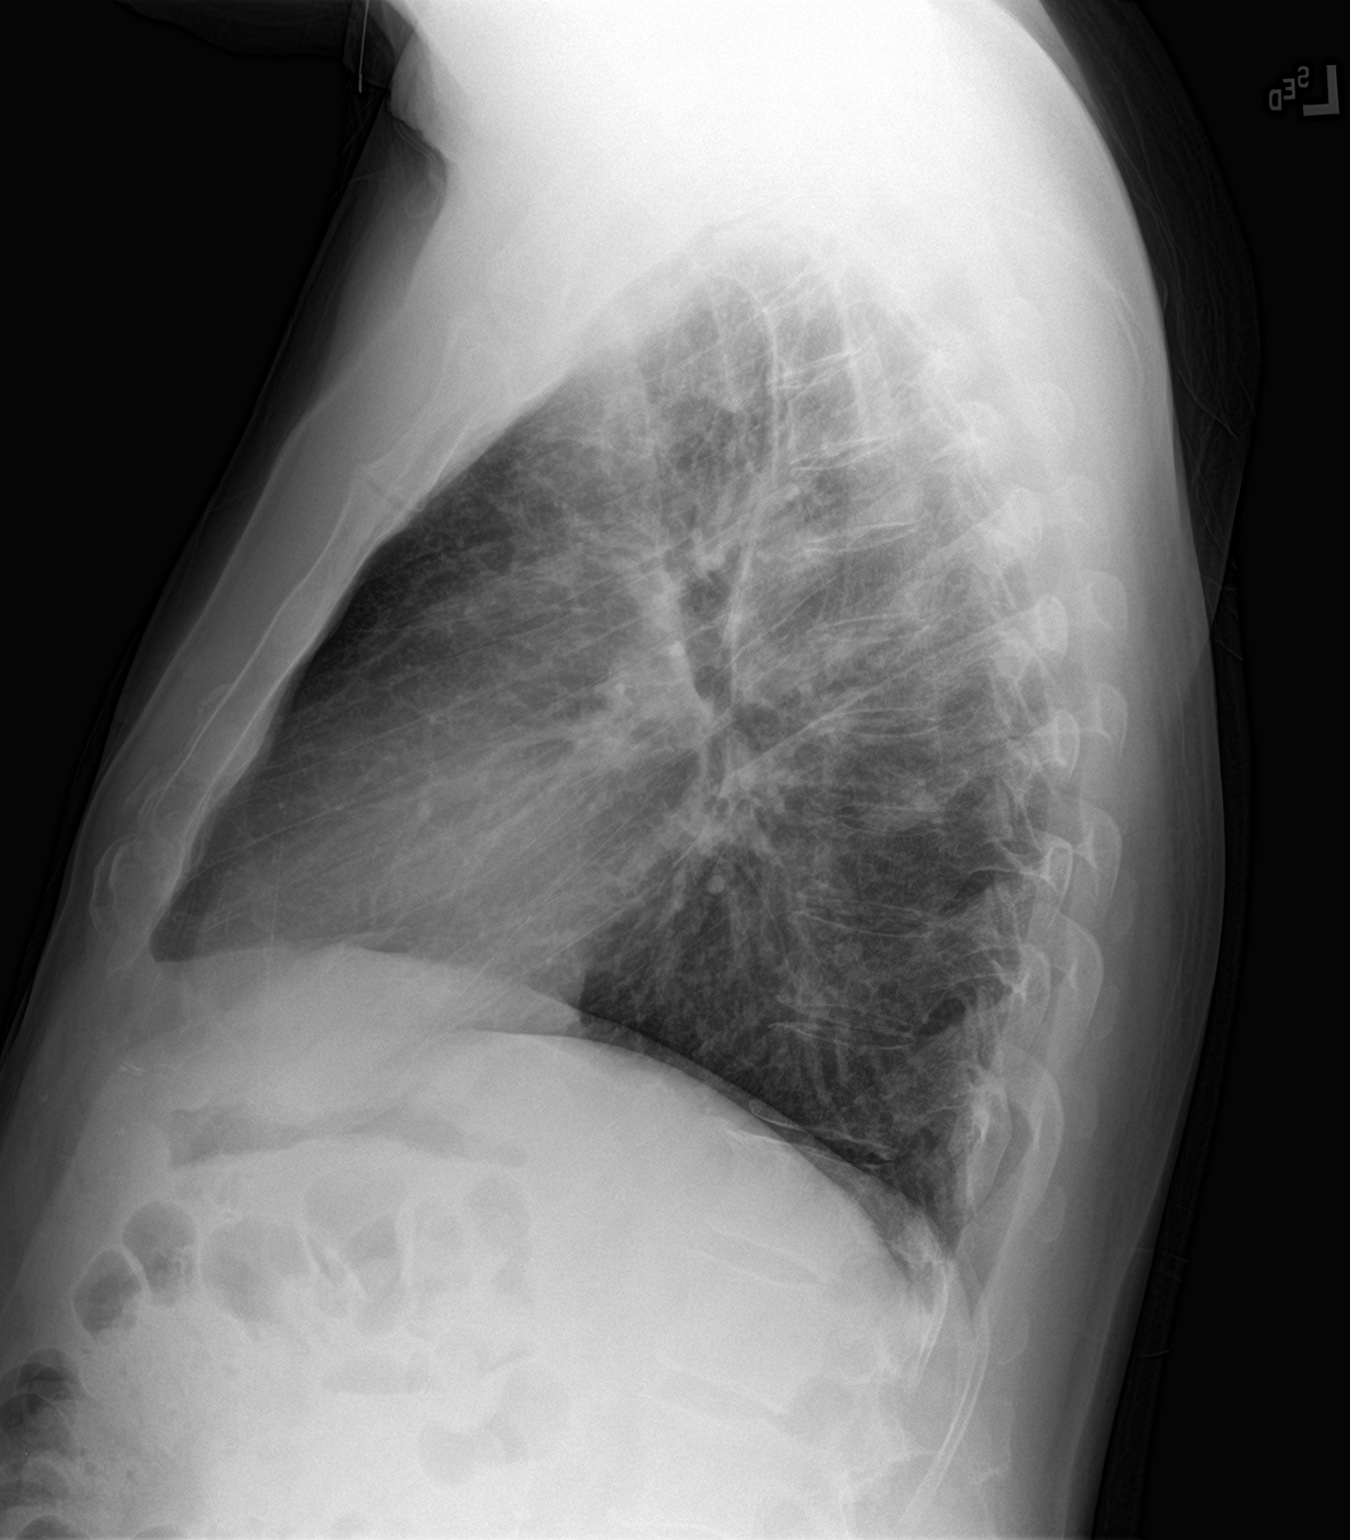

[2 of 2 positions shown; findings below may reference images not displayed]

FINDINGS: Cardiac silhouette is normal in size. No mediastinal or hilar
masses. No evidence of adenopathy.

There hazy bilateral airspace lung opacities most evident on the
left, which are similar to the prior exams. No evidence of pulmonary
edema. No pleural effusion or pneumothorax.

Skeletal structures intact.
IMPRESSION: 1. Subtle bilateral hazy airspace lung opacities, which are similar
to the prior exams. Findings consistent multifocal pneumonia in the
proper clinical setting. The opacities could be inflammatory. No
evidence of pulmonary edema.

## 2020-06-01 ENCOUNTER — Telehealth: Payer: Self-pay | Admitting: General Practice

## 2020-06-01 NOTE — Telephone Encounter (Signed)
Confirmed patient has insurance with [employer], ineligible for [facility].

## 2022-12-18 ENCOUNTER — Encounter (HOSPITAL_COMMUNITY): Payer: Self-pay

## 2022-12-18 ENCOUNTER — Emergency Department (HOSPITAL_COMMUNITY): Payer: Medicaid Other

## 2022-12-18 ENCOUNTER — Inpatient Hospital Stay (HOSPITAL_COMMUNITY)
Admission: EM | Admit: 2022-12-18 | Discharge: 2022-12-23 | DRG: 958 | Disposition: A | Discharge status: Home Health | Payer: Medicaid Other | Attending: Surgery | Admitting: Surgery

## 2022-12-18 ENCOUNTER — Inpatient Hospital Stay (HOSPITAL_COMMUNITY): Payer: Medicaid Other

## 2022-12-18 ENCOUNTER — Other Ambulatory Visit: Payer: Self-pay

## 2022-12-18 DIAGNOSIS — S3289XA Fracture of other parts of pelvis, initial encounter for closed fracture: Secondary | ICD-10-CM | POA: Diagnosis not present

## 2022-12-18 DIAGNOSIS — F4310 Post-traumatic stress disorder, unspecified: Secondary | ICD-10-CM | POA: Diagnosis present

## 2022-12-18 DIAGNOSIS — D62 Acute posthemorrhagic anemia: Secondary | ICD-10-CM | POA: Diagnosis present

## 2022-12-18 DIAGNOSIS — W19XXXA Unspecified fall, initial encounter: Secondary | ICD-10-CM | POA: Diagnosis not present

## 2022-12-18 DIAGNOSIS — R531 Weakness: Secondary | ICD-10-CM | POA: Diagnosis not present

## 2022-12-18 DIAGNOSIS — S32811A Multiple fractures of pelvis with unstable disruption of pelvic ring, initial encounter for closed fracture: Secondary | ICD-10-CM | POA: Diagnosis not present

## 2022-12-18 DIAGNOSIS — S32392A Other fracture of left ilium, initial encounter for closed fracture: Secondary | ICD-10-CM | POA: Diagnosis not present

## 2022-12-18 DIAGNOSIS — S32301A Unspecified fracture of right ilium, initial encounter for closed fracture: Secondary | ICD-10-CM | POA: Diagnosis not present

## 2022-12-18 DIAGNOSIS — S32502A Unspecified fracture of left pubis, initial encounter for closed fracture: Secondary | ICD-10-CM | POA: Diagnosis not present

## 2022-12-18 DIAGNOSIS — Z7952 Long term (current) use of systemic steroids: Secondary | ICD-10-CM | POA: Diagnosis not present

## 2022-12-18 DIAGNOSIS — Z043 Encounter for examination and observation following other accident: Secondary | ICD-10-CM | POA: Diagnosis not present

## 2022-12-18 DIAGNOSIS — Y9289 Other specified places as the place of occurrence of the external cause: Secondary | ICD-10-CM

## 2022-12-18 DIAGNOSIS — W12XXXA Fall on and from scaffolding, initial encounter: Secondary | ICD-10-CM | POA: Diagnosis present

## 2022-12-18 DIAGNOSIS — S32302A Unspecified fracture of left ilium, initial encounter for closed fracture: Secondary | ICD-10-CM | POA: Diagnosis not present

## 2022-12-18 DIAGNOSIS — Z635 Disruption of family by separation and divorce: Secondary | ICD-10-CM | POA: Diagnosis not present

## 2022-12-18 DIAGNOSIS — S36039A Unspecified laceration of spleen, initial encounter: Secondary | ICD-10-CM | POA: Diagnosis not present

## 2022-12-18 DIAGNOSIS — S2232XA Fracture of one rib, left side, initial encounter for closed fracture: Secondary | ICD-10-CM | POA: Diagnosis not present

## 2022-12-18 DIAGNOSIS — S36031A Moderate laceration of spleen, initial encounter: Secondary | ICD-10-CM | POA: Diagnosis not present

## 2022-12-18 DIAGNOSIS — Z79899 Other long term (current) drug therapy: Secondary | ICD-10-CM

## 2022-12-18 DIAGNOSIS — S3210XA Unspecified fracture of sacrum, initial encounter for closed fracture: Secondary | ICD-10-CM | POA: Diagnosis not present

## 2022-12-18 DIAGNOSIS — I728 Aneurysm of other specified arteries: Secondary | ICD-10-CM | POA: Diagnosis present

## 2022-12-18 DIAGNOSIS — T07XXXA Unspecified multiple injuries, initial encounter: Principal | ICD-10-CM

## 2022-12-18 DIAGNOSIS — Z789 Other specified health status: Secondary | ICD-10-CM | POA: Diagnosis not present

## 2022-12-18 DIAGNOSIS — F17210 Nicotine dependence, cigarettes, uncomplicated: Secondary | ICD-10-CM | POA: Diagnosis present

## 2022-12-18 DIAGNOSIS — R600 Localized edema: Secondary | ICD-10-CM | POA: Diagnosis not present

## 2022-12-18 DIAGNOSIS — M954 Acquired deformity of chest and rib: Secondary | ICD-10-CM | POA: Diagnosis not present

## 2022-12-18 DIAGNOSIS — Y990 Civilian activity done for income or pay: Secondary | ICD-10-CM

## 2022-12-18 DIAGNOSIS — R918 Other nonspecific abnormal finding of lung field: Secondary | ICD-10-CM | POA: Diagnosis not present

## 2022-12-18 DIAGNOSIS — S3219XA Other fracture of sacrum, initial encounter for closed fracture: Secondary | ICD-10-CM | POA: Diagnosis not present

## 2022-12-18 DIAGNOSIS — S2242XA Multiple fractures of ribs, left side, initial encounter for closed fracture: Secondary | ICD-10-CM | POA: Diagnosis present

## 2022-12-18 DIAGNOSIS — Z9889 Other specified postprocedural states: Secondary | ICD-10-CM | POA: Diagnosis not present

## 2022-12-18 DIAGNOSIS — S3282XA Multiple fractures of pelvis without disruption of pelvic ring, initial encounter for closed fracture: Secondary | ICD-10-CM | POA: Diagnosis not present

## 2022-12-18 DIAGNOSIS — S32810A Multiple fractures of pelvis with stable disruption of pelvic ring, initial encounter for closed fracture: Secondary | ICD-10-CM | POA: Diagnosis not present

## 2022-12-18 DIAGNOSIS — S0990XA Unspecified injury of head, initial encounter: Secondary | ICD-10-CM | POA: Diagnosis not present

## 2022-12-18 DIAGNOSIS — S36030A Superficial (capsular) laceration of spleen, initial encounter: Secondary | ICD-10-CM | POA: Diagnosis not present

## 2022-12-18 HISTORY — PX: IR US GUIDE VASC ACCESS RIGHT: IMG2390

## 2022-12-18 HISTORY — PX: IR EMBO ART  VEN HEMORR LYMPH EXTRAV  INC GUIDE ROADMAPPING: IMG5450

## 2022-12-18 HISTORY — PX: IR ANGIOGRAM VISCERAL SELECTIVE: IMG657

## 2022-12-18 HISTORY — PX: IR ANGIOGRAM PELVIS SELECTIVE OR SUPRASELECTIVE: IMG661

## 2022-12-18 LAB — CBC WITH DIFFERENTIAL/PLATELET
Abs Immature Granulocytes: 0.06 10*3/uL (ref 0.00–0.07)
Basophils Absolute: 0.1 10*3/uL (ref 0.0–0.1)
Basophils Relative: 1 %
Eosinophils Absolute: 0.4 10*3/uL (ref 0.0–0.5)
Eosinophils Relative: 5 %
HCT: 37.3 % — ABNORMAL LOW (ref 39.0–52.0)
Hemoglobin: 12.3 g/dL — ABNORMAL LOW (ref 13.0–17.0)
Immature Granulocytes: 1 %
Lymphocytes Relative: 18 %
Lymphs Abs: 1.6 10*3/uL (ref 0.7–4.0)
MCH: 28.7 pg (ref 26.0–34.0)
MCHC: 33 g/dL (ref 30.0–36.0)
MCV: 87.1 fL (ref 80.0–100.0)
Monocytes Absolute: 0.8 10*3/uL (ref 0.1–1.0)
Monocytes Relative: 9 %
Neutro Abs: 5.7 10*3/uL (ref 1.7–7.7)
Neutrophils Relative %: 66 %
Platelets: 183 10*3/uL (ref 150–400)
RBC: 4.28 MIL/uL (ref 4.22–5.81)
RDW: 13.2 % (ref 11.5–15.5)
WBC: 8.6 10*3/uL (ref 4.0–10.5)
nRBC: 0 % (ref 0.0–0.2)

## 2022-12-18 LAB — BASIC METABOLIC PANEL
Anion gap: 9 (ref 5–15)
BUN: 17 mg/dL (ref 6–20)
CO2: 24 mmol/L (ref 22–32)
Calcium: 8.8 mg/dL — ABNORMAL LOW (ref 8.9–10.3)
Chloride: 104 mmol/L (ref 98–111)
Creatinine, Ser: 1.19 mg/dL (ref 0.61–1.24)
GFR, Estimated: >60 mL/min (ref >60)
Glucose, Bld: 92 mg/dL (ref 70–99)
Potassium: 3.5 mmol/L (ref 3.5–5.1)
Sodium: 137 mmol/L (ref 135–145)

## 2022-12-18 LAB — CBC
HCT: 33.2 % — ABNORMAL LOW (ref 39.0–52.0)
Hemoglobin: 11.5 g/dL — ABNORMAL LOW (ref 13.0–17.0)
MCH: 29.4 pg (ref 26.0–34.0)
MCHC: 34.6 g/dL (ref 30.0–36.0)
MCV: 84.9 fL (ref 80.0–100.0)
Platelets: 186 10*3/uL (ref 150–400)
RBC: 3.91 MIL/uL — ABNORMAL LOW (ref 4.22–5.81)
RDW: 13.4 % (ref 11.5–15.5)
WBC: 9.6 10*3/uL (ref 4.0–10.5)
nRBC: 0 % (ref 0.0–0.2)

## 2022-12-18 LAB — ABO/RH: ABO/RH(D): O POS

## 2022-12-18 LAB — TYPE AND SCREEN
ABO/RH(D): O POS
Antibody Screen: NEGATIVE

## 2022-12-18 LAB — ETHANOL: Alcohol, Ethyl (B): <10 mg/dL (ref <10)

## 2022-12-18 MED ORDER — ORAL CARE MOUTH RINSE: 15.0000 mL | OROMUCOSAL | Status: DC | PRN

## 2022-12-18 MED ORDER — MIDAZOLAM HCL 2 MG/2ML IJ SOLN
INTRAMUSCULAR | Status: AC
  Filled 2022-12-18: qty 6

## 2022-12-18 MED ORDER — HYDROMORPHONE HCL 1 MG/ML IJ SOLN
0.5000 mg | INTRAMUSCULAR | Status: DC | PRN
  Administered 2022-12-18 – 2022-12-19 (×4): 1 mg via INTRAVENOUS
  Filled 2022-12-18 (×5): qty 1

## 2022-12-18 MED ORDER — LIDOCAINE HCL 1 % IJ SOLN
INTRAMUSCULAR | Status: AC
  Filled 2022-12-18: qty 20

## 2022-12-18 MED ORDER — IOHEXOL 350 MG/ML SOLN
75.0000 mL | Freq: Once | INTRAVENOUS | Status: AC | PRN
  Administered 2022-12-18: 75 mL via INTRAVENOUS

## 2022-12-18 MED ORDER — GABAPENTIN 400 MG PO CAPS
800.0000 mg | ORAL_CAPSULE | Freq: Two times a day (BID) | ORAL | Status: DC
  Administered 2022-12-18 – 2022-12-23 (×10): 800 mg via ORAL
  Filled 2022-12-18 (×10): qty 2

## 2022-12-18 MED ORDER — DIAZEPAM 5 MG/ML IJ SOLN
5.0000 mg | Freq: Once | INTRAMUSCULAR | Status: AC
  Administered 2022-12-18: 5 mg via INTRAVENOUS
  Filled 2022-12-18: qty 2

## 2022-12-18 MED ORDER — IOHEXOL 300 MG/ML  SOLN
150.0000 mL | Freq: Once | INTRAMUSCULAR | Status: AC | PRN
  Administered 2022-12-18: 40 mL via INTRA_ARTERIAL

## 2022-12-18 MED ORDER — ACETAMINOPHEN 325 MG PO TABS: 650.0000 mg | ORAL_TABLET | ORAL | Status: DC | PRN

## 2022-12-18 MED ORDER — OXYCODONE HCL 5 MG PO TABS
5.0000 mg | ORAL_TABLET | ORAL | Status: DC | PRN
  Administered 2022-12-18: 10 mg via ORAL
  Filled 2022-12-18: qty 2

## 2022-12-18 MED ORDER — PROCHLORPERAZINE MALEATE 10 MG PO TABS: 10.0000 mg | ORAL_TABLET | Freq: Four times a day (QID) | ORAL | Status: DC | PRN

## 2022-12-18 MED ORDER — KCL IN DEXTROSE-NACL 20-5-0.45 MEQ/L-%-% IV SOLN
INTRAVENOUS | Status: DC
  Filled 2022-12-18 (×4): qty 1000

## 2022-12-18 MED ORDER — LACTATED RINGERS IV BOLUS
1000.0000 mL | Freq: Once | INTRAVENOUS | Status: AC
  Administered 2022-12-18: 1000 mL via INTRAVENOUS

## 2022-12-18 MED ORDER — HYDROMORPHONE HCL 1 MG/ML IJ SOLN
1.0000 mg | Freq: Once | INTRAMUSCULAR | Status: AC
  Administered 2022-12-18: 1 mg via INTRAVENOUS
  Filled 2022-12-18: qty 1

## 2022-12-18 MED ORDER — ONDANSETRON HCL 4 MG/2ML IJ SOLN: 4.0000 mg | Freq: Four times a day (QID) | INTRAMUSCULAR | Status: DC | PRN

## 2022-12-18 MED ORDER — MIDAZOLAM HCL 2 MG/2ML IJ SOLN
2.0000 mg | Freq: Once | INTRAMUSCULAR | Status: AC
  Administered 2022-12-18: 2 mg via INTRAVENOUS
  Filled 2022-12-18: qty 2

## 2022-12-18 MED ORDER — HYDROMORPHONE HCL 1 MG/ML IJ SOLN: 1.0000 mg | INTRAMUSCULAR | Status: DC | PRN

## 2022-12-18 MED ORDER — MIDAZOLAM HCL 2 MG/2ML IJ SOLN
INTRAMUSCULAR | Status: AC | PRN
  Administered 2022-12-18: .5 mg via INTRAVENOUS
  Administered 2022-12-18: 1 mg via INTRAVENOUS
  Administered 2022-12-18 (×2): .5 mg via INTRAVENOUS

## 2022-12-18 MED ORDER — FENTANYL CITRATE (PF) 100 MCG/2ML IJ SOLN
INTRAMUSCULAR | Status: AC
  Filled 2022-12-18: qty 6

## 2022-12-18 MED ORDER — CHLORHEXIDINE GLUCONATE CLOTH 2 % EX PADS
6.0000 | MEDICATED_PAD | Freq: Every day | CUTANEOUS | Status: DC
  Administered 2022-12-19 – 2022-12-21 (×3): 6 via TOPICAL

## 2022-12-18 MED ORDER — FENTANYL CITRATE (PF) 100 MCG/2ML IJ SOLN
INTRAMUSCULAR | Status: AC | PRN
  Administered 2022-12-18 (×3): 25 ug via INTRAVENOUS
  Administered 2022-12-18: 50 ug via INTRAVENOUS

## 2022-12-18 MED ORDER — GABAPENTIN 800 MG PO TABS
800.0000 mg | ORAL_TABLET | Freq: Two times a day (BID) | ORAL | Status: DC
  Filled 2022-12-18: qty 1

## 2022-12-18 MED ORDER — ONDANSETRON 4 MG PO TBDP
4.0000 mg | ORAL_TABLET | Freq: Four times a day (QID) | ORAL | Status: DC | PRN
  Administered 2022-12-20: 4 mg via ORAL
  Filled 2022-12-18: qty 1

## 2022-12-18 MED ORDER — BUPRENORPHINE HCL-NALOXONE HCL 8-2 MG SL SUBL
2.0000 | SUBLINGUAL_TABLET | Freq: Every day | SUBLINGUAL | Status: DC
  Administered 2022-12-19 – 2022-12-20 (×2): 2 via SUBLINGUAL
  Filled 2022-12-18 (×2): qty 2

## 2022-12-18 MED ORDER — METHOCARBAMOL 500 MG PO TABS
500.0000 mg | ORAL_TABLET | Freq: Three times a day (TID) | ORAL | Status: DC | PRN
  Administered 2022-12-19: 500 mg via ORAL
  Filled 2022-12-18: qty 1

## 2022-12-18 MED ORDER — METHOCARBAMOL 1000 MG/10ML IJ SOLN: 500.0000 mg | Freq: Three times a day (TID) | INTRAVENOUS | Status: DC | PRN

## 2022-12-18 MED ORDER — MELATONIN 3 MG PO TABS: 3.0000 mg | ORAL_TABLET | Freq: Every evening | ORAL | Status: DC | PRN

## 2022-12-18 MED ORDER — DOCUSATE SODIUM 100 MG PO CAPS
100.0000 mg | ORAL_CAPSULE | Freq: Two times a day (BID) | ORAL | Status: DC
  Administered 2022-12-18 – 2022-12-23 (×10): 100 mg via ORAL
  Filled 2022-12-18 (×10): qty 1

## 2022-12-18 MED ORDER — PROCHLORPERAZINE EDISYLATE 10 MG/2ML IJ SOLN: 5.0000 mg | Freq: Four times a day (QID) | INTRAMUSCULAR | Status: DC | PRN

## 2022-12-18 NOTE — Procedures (Addendum)
Interventional Radiology Procedure Note  Procedure:  US guided right CFA access.  Splenic angiogram, subselective catheterization, embolization of lower pole branches to treat pseudoaneurysm with selective coil embolization.   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   Embolization to stasis of the affected branches.   Complications: None Recommendations:  - Right hip straight x 1 hr during sedation recovery. CELT closure device which has an indication for "immediate ambulation".   - Bedrest for trauma - routine wound care right hip - Do not submerge for 7 day   Signed,  Dulcy Fanny. Earleen Newport, DO

## 2022-12-18 NOTE — ED Provider Notes (Signed)
[place] EMERGENCY DEPARTMENT AT [facility] Provider Note   CSN: [id number] Arrival date & time: 12/18/22  1526     History  Chief Complaint  Patient presents with   Brent Pittman is a 57 y.o. male.  57 year old male presents after fall.  Patient was approximately 7 feet high on scaffolding when he lost his balance and fell onto his left side.  No LOC.  Complains of pain to his left chest as well as left hip.  Denies any new focal weakness.  Endorses slight dyspnea.  No abdominal discomfort.  EMS was called and patient given of fentanyl for pain and transported here       Home Medications Prior to Admission medications   Medication Sig Start Date End Date Taking? Authorizing Provider  azithromycin (ZITHROMAX Z-PAK) 250 MG tablet Take 2 tablets (500 mg) on  Day 1,  followed by 1 tablet (250 mg) once daily on Days 2 through 5. 03/31/20   Cuthriell, Christiane Ha D, PA-C  brompheniramine-pseudoephedrine-DM 30-2-10 MG/5ML syrup Take 10 mLs by mouth 4 (four) times daily as needed. 03/31/20   Cuthriell, Delorise Royals, PA-C  cephALEXin (KEFLEX) 500 MG capsule Take 1 capsule (500 mg total) by mouth 3 (three) times daily. 02/09/20   Fisher, Roselyn Bering, PA-C  gabapentin (NEURONTIN) 800 MG tablet Take 800 mg by mouth 2 (two) times daily.    [provider]  HYDROcodone-acetaminophen (NORCO/VICODIN) 5-325 MG tablet Take 1 tablet by mouth every 6 (six) hours as needed for moderate pain. 02/09/20   Fisher, Roselyn Bering, PA-C  ibuprofen (ADVIL) 800 MG tablet Take 800 mg by mouth every 8 (eight) hours as needed.    [provider]  predniSONE (DELTASONE) 20 MG tablet Take 2 tablets (40 mg total) by mouth daily. 01/13/20   Sharyn Creamer, MD      Allergies    Patient has no known allergies.    Review of Systems   Review of Systems  All other systems reviewed and are negative.   Physical Exam Updated Vital Signs BP 128/88   Pulse 78   Temp 98.1 F (36.7 C) (Oral)    Resp (!) 28   Ht 1.778 m (5\' 10" )   Wt 77.1 kg   SpO2 100%   BMI 24.39 kg/m  Physical Exam Vitals and nursing note reviewed.  Constitutional:      General: He is not in acute distress.    Appearance: Normal appearance. He is well-developed. He is not toxic-appearing.  HENT:     Head: Normocephalic and atraumatic.  Eyes:     General: Lids are normal.     Conjunctiva/sclera: Conjunctivae normal.     Pupils: Pupils are equal, round, and reactive to light.  Neck:     Thyroid: No thyroid mass.     Trachea: No tracheal deviation.  Cardiovascular:     Rate and Rhythm: Normal rate and regular rhythm.     Heart sounds: Normal heart sounds. No murmur heard.    No gallop.  Pulmonary:     Effort: Pulmonary effort is normal. No respiratory distress.     Breath sounds: Normal breath sounds. No stridor. No decreased breath sounds, wheezing, rhonchi or rales.  Chest:     Chest wall: Tenderness present. No crepitus.    Abdominal:     General: There is no distension.     Palpations: Abdomen is soft.     Tenderness: There is no abdominal tenderness. There  is no rebound.  Musculoskeletal:     Cervical back: Normal range of motion and neck supple.     Left hip: Tenderness and bony tenderness present. No deformity. Decreased range of motion.       Legs:  Skin:    General: Skin is warm and dry.     Findings: No abrasion or rash.  Neurological:     General: No focal deficit present.     Mental Status: He is alert and oriented to person, place, and time. Mental status is at baseline.     GCS: GCS eye subscore is 4. GCS verbal subscore is 5. GCS motor subscore is 6.     Cranial Nerves: No cranial nerve deficit.     Sensory: No sensory deficit.     Motor: Motor function is intact.     Comments: Neurovasc intact in all 4 extremities  Psychiatric:        Attention and Perception: Attention normal.        Speech: Speech normal.        Behavior: Behavior normal.     ED Results / Procedures  / Treatments   Labs (all labs ordered are listed, but only abnormal results are displayed) Labs Reviewed - No data to display  EKG None  Radiology No results found.  Procedures Procedures    Medications Ordered in ED Medications  lactated ringers bolus 1,000 mL (has no administration in time range)    ED Course/ Medical Decision Making/ A&P                             Medical Decision Making Amount and/or Complexity of Data Reviewed Labs: ordered. Radiology: ordered.  Risk Prescription drug management.   Patient medicated for pain with hydromorphone as well as give muscle laxation benzodiazepines.  Chest x-ray showed multiple rib fractures in the left side.  Patient's pelvis x-ray showed left-sided pelvic fractures.  Patient stable at this point to go over to department to have his CT scans.  His CT scan of his head and neck per my interpretation and review showed no acute findings.  Patient was in a cervical collar which was removed.  He has no neck pain at this time.  Patient's CT of his abdomen and pelvis as well as chest showed a splenic laceration as well as multiple left-sided rib fractures as well as a severe pelvic fracture.  This was reviewed and interpreted by me.  Case discussed with Dr. Barry Dienes on-call for trauma surgery who will see the patient.  Case discussed with orthopedic surgeon Dr. Lyla Glassing who also come and see the patient as needed.  Patient informed of his results in front of his wife and he will be admitted  [place] Performed by: Leota Jacobsen Total critical care time: 50 minutes Critical care time was exclusive of separately billable procedures and treating other patients. Critical care was necessary to treat or prevent imminent or life-threatening deterioration. Critical care was time spent personally by me on the following activities: development of treatment plan with patient and/or surrogate as well as nursing, discussions with consultants,  evaluation of patient's response to treatment, examination of patient, obtaining history from patient or surrogate, ordering and performing treatments and interventions, ordering and review of laboratory studies, ordering and review of radiographic studies, pulse oximetry and re-evaluation of patient's condition.         Final Clinical Impression(s) / ED Diagnoses Final diagnoses:  None    Rx / DC Orders ED Discharge Orders     None         Lacretia Leigh, MD 12/18/22 1810

## 2022-12-18 NOTE — Consult Note (Addendum)
ORTHOPAEDIC CONSULTATION  REQUESTING PHYSICIAN: Md, Trauma, MD  PCP:  Patient, No Pcp Per  Chief Complaint: fall, left hip and rib pain.   HPI: Brent Pittman is a 57 y.o. male who presents to the ED after fall. Patient was approximately 7 feet high on scaffolding when he lost his balance and fell onto his left side. No LOC. Complains of pain to his left chest as well as left hip. He denies any tingling or numbness in LE bilaterally. X-rays showed multiple rib fractures and LC2 pelvis fracture. Orthopedics was consulted.   Past Medical History:  Diagnosis Date   Arthritis    Past Surgical History:  Procedure Laterality Date   NECK SURGERY     Social History   Socioeconomic History   Marital status: Legally Separated    Spouse name: Not on file   Number of children: Not on file   Years of education: Not on file   Highest education level: Not on file  Occupational History   Not on file  Tobacco Use   Smoking status: Some Days    Types: Cigarettes   Smokeless tobacco: Current  Vaping Use   Vaping Use: Never used  Substance and Sexual Activity   Alcohol use: Yes   Drug use: Never   Sexual activity: Not on file  Other Topics Concern   Not on file  Social History Narrative   Not on file   Social Determinants of Health   Financial Resource Strain: Not on file  Food Insecurity: Not on file  Transportation Needs: Not on file  Physical Activity: Not on file  Stress: Not on file  Social Connections: Not on file   History reviewed. No pertinent family history. No Known Allergies Prior to Admission medications   Medication Sig Start Date End Date Taking? Authorizing Provider  azithromycin (ZITHROMAX Z-PAK) 250 MG tablet Take 2 tablets (500 mg) on  Day 1,  followed by 1 tablet (250 mg) once daily on Days 2 through 5. 03/31/20   Cuthriell, Christiane Ha D, PA-C  brompheniramine-pseudoephedrine-DM 30-2-10 MG/5ML syrup Take 10 mLs by mouth 4 (four) times daily as needed.  03/31/20   Cuthriell, Delorise Royals, PA-C  buprenorphine-naloxone (SUBOXONE) 8-2 mg SUBL SL tablet Place 2 tablets under the tongue daily. 11/23/22   [provider]  cephALEXin (KEFLEX) 500 MG capsule Take 1 capsule (500 mg total) by mouth 3 (three) times daily. 02/09/20   Fisher, Roselyn Bering, PA-C  gabapentin (NEURONTIN) 800 MG tablet Take 800 mg by mouth 2 (two) times daily.    [provider]  HYDROcodone-acetaminophen (NORCO/VICODIN) 5-325 MG tablet Take 1 tablet by mouth every 6 (six) hours as needed for moderate pain. 02/09/20   Fisher, Roselyn Bering, PA-C  ibuprofen (ADVIL) 800 MG tablet Take 800 mg by mouth every 8 (eight) hours as needed.    [provider]  predniSONE (DELTASONE) 20 MG tablet Take 2 tablets (40 mg total) by mouth daily. 01/13/20   Sharyn Creamer, MD   CT CHEST ABDOMEN PELVIS W CONTRAST  Result Date: 12/18/2022 CLINICAL DATA:  7 foot fall off scaffolding. EXAM: CT CHEST, ABDOMEN, AND PELVIS WITH CONTRAST TECHNIQUE: Multidetector CT imaging of the chest, abdomen and pelvis was performed following the standard protocol during bolus administration of intravenous contrast. RADIATION DOSE REDUCTION: This exam was performed according to the departmental dose-optimization program which includes automated exposure control, adjustment of the mA and/or kV according to patient size and/or use of iterative reconstruction technique. CONTRAST:  42mL OMNIPAQUE IOHEXOL 350 MG/ML SOLN COMPARISON:  Chest x-ray earlier 12/18/2022. CT angiogram chest 01/13/2020 FINDINGS: CT CHEST FINDINGS Cardiovascular: Significant streak artifact along the low neck and upper chest from the patient's extensive fixation hardware in the cervical spine. Heart is nonenlarged. No pericardial effusion. Pulsation artifact along the ascending aorta but no mediastinal hematoma. Grossly normal caliber thoracic aorta. No dissection or aneurysm formation. Minimal atherosclerotic disease. The central pulmonary arteries  are preserved. Mediastinum/Nodes: No specific abnormal lymph node enlargement seen in the axillary region, hilum or mediastinum. No pericardial effusion. Small hiatal hernia. Mild gynecomastia. Lungs/Pleura: Breathing motion identified. There is dependent areas of ground-glass bilaterally particularly along the lower lobes. Atelectasis is favored. There is some peripheral areas of interstitial septal thickening with scarring and fibrotic changes along the right side including towards the middle lobe. No clear pneumothorax. Small apical subpleural blebs identified. Additional areas left lower lobe. Likely small left pleural effusion and likely extrapleural hematoma posteroinferior. Musculoskeletal: Multiple posterior left-sided rib fractures are identified. This includes the second, third, fourth, fifth, sixth, seventh, eighth, ninth ribs. No definite scapular fracture on the left. There several ribs which are fractured in more than 1 location. There are fractures of the lateral margin of the left fourth, fifth, sixth, seventh ribs. Please correlate for any flail chest physiology. CT ABDOMEN PELVIS FINDINGS Hepatobiliary: There are some small low-attenuation lesion seen in the liver consistent with benign cysts. Largest is seen in the dome in segment 4 measuring 15 mm in diameter. No specific imaging follow-up. No space-occupying liver lesion otherwise. Gallbladder is nondilated. Patent portal vein. Motion seen throughout the abdomen. Pancreas: Pancreas is grossly preserved without mass lesion or ductal dilatation. There is some subtle heterogeneous enhancement to the midportion of the spleen with some central areas of intraparenchymal active extravasation. Mild adjacent hematoma inferiorly along the splenic hilum. The splenic artery and vein are grossly enhancing. Spleen: Adrenal glands are preserved. No enhancing renal mass or collecting system dilatation. Adrenals/Urinary Tract: Preserved contours of the urinary  bladder. Stomach/Bowel: Large bowel is of normal course and caliber with scattered stool. Overall moderate stool burden. Normal appendix extends inferior to the cecum in the right lower quadrant. The stomach and small bowel are nondilated. Vascular/Lymphatic: Normal caliber aorta and IVC with scattered atherosclerotic calcifications. No specific abnormal lymph node enlargement identified in the abdomen and pelvis. Reproductive: Prostate is unremarkable. Other: No ascites or free air. Small fat containing umbilical hernia. Musculoskeletal: Multiple pelvic fractures are noted. There is subtle fracture of the left hemi sacral Alla. There also fractures of the posterior left iliac bone near the location of the sacroiliac joint. Left inferior and superior pubic rami fractures are identified. There is some hematoma surrounding the pubic bone fractures including along the extraperitoneal space of the low pelvis. No active extravasation seen in this location. Critical Value/emergent results were called by telephone at the time of interpretation on 12/18/2022 at 5:30 pm to provider Lacretia Leigh , who verbally acknowledged these results. IMPRESSION: Heterogeneous spleen with a small amount of perisplenic hematoma but areas of intrasubstance active extravasation. Extensive left-sided rib fractures of the second through ninth ribs posteriorly. There are some additional fractures along the lateral margin of the left-sided ribcage involving ribs 4 through 7. Many of these are nondisplaced but please correlate with any clinical evidence of flail chest. There is a small extrapleural hematoma and some trace left-sided pleural fluid but less than 300 cc estimated. No pneumothorax. Multiple left-sided hemipelvic fractures involving the sacrum, iliac  bone and pubic bones. Small extraperitoneal hematoma along the low anterior left hemipelvis. No clear active extravasation. No additional evidence of free air, free fluid or solid organ  injury. Motion.  Artifacts. Electronically Signed   By: Jill Side M.D.   On: 12/18/2022 17:38   CT Head Wo Contrast  Result Date: 12/18/2022 CLINICAL DATA:  [place]. EXAM: CT HEAD WITHOUT CONTRAST CT CERVICAL SPINE WITHOUT CONTRAST TECHNIQUE: Multidetector CT imaging of the head and cervical spine was performed following the standard protocol without intravenous contrast. Multiplanar CT image reconstructions of the cervical spine were also generated. RADIATION DOSE REDUCTION: This exam was performed according to the departmental dose-optimization program which includes automated exposure control, adjustment of the mA and/or kV according to patient size and/or use of iterative reconstruction technique. COMPARISON:  None Available. FINDINGS: CT HEAD FINDINGS Brain: No evidence of acute infarction, hemorrhage, hydrocephalus, extra-axial collection or mass lesion/mass effect. Vascular: Minimal vascular calcifications. No aneurysm or hyperdense vessels. Skull: No skull fracture or bone lesions. Sinuses/Orbits: Paranasal sinus disease most notably involving the left maxillary sinus and scattered ethmoid air cells. Moderate mucoperiosteal thickening in both halves of the sphenoid sinus. The mastoid air cells and middle ear cavities are clear. Other: No scalp lesions or scalp hematoma. CT CERVICAL SPINE FINDINGS Alignment: Normal Skull base and vertebrae: No obvious fracture. Significant artifact related to anterior and interbody fusion hardware. Soft tissues and spinal canal: No prevertebral fluid or swelling. No visible canal hematoma. Disc levels: The spinal canal is fairly generous. No canal stenosis. Upper chest: The lung apices are grossly clear. Other: Non IMPRESSION: 1. No acute intracranial findings or skull fracture. 2. Paranasal sinus disease. 3. Normal alignment of the cervical spine without acute fracture. 4. Significant artifact related to anterior and interbody fusion hardware. Electronically Signed   By:  Marijo Sanes M.D.   On: 12/18/2022 17:36   CT Cervical Spine Wo Contrast  Result Date: 12/18/2022 CLINICAL DATA:  [place]. EXAM: CT HEAD WITHOUT CONTRAST CT CERVICAL SPINE WITHOUT CONTRAST TECHNIQUE: Multidetector CT imaging of the head and cervical spine was performed following the standard protocol without intravenous contrast. Multiplanar CT image reconstructions of the cervical spine were also generated. RADIATION DOSE REDUCTION: This exam was performed according to the departmental dose-optimization program which includes automated exposure control, adjustment of the mA and/or kV according to patient size and/or use of iterative reconstruction technique. COMPARISON:  None Available. FINDINGS: CT HEAD FINDINGS Brain: No evidence of acute infarction, hemorrhage, hydrocephalus, extra-axial collection or mass lesion/mass effect. Vascular: Minimal vascular calcifications. No aneurysm or hyperdense vessels. Skull: No skull fracture or bone lesions. Sinuses/Orbits: Paranasal sinus disease most notably involving the left maxillary sinus and scattered ethmoid air cells. Moderate mucoperiosteal thickening in both halves of the sphenoid sinus. The mastoid air cells and middle ear cavities are clear. Other: No scalp lesions or scalp hematoma. CT CERVICAL SPINE FINDINGS Alignment: Normal Skull base and vertebrae: No obvious fracture. Significant artifact related to anterior and interbody fusion hardware. Soft tissues and spinal canal: No prevertebral fluid or swelling. No visible canal hematoma. Disc levels: The spinal canal is fairly generous. No canal stenosis. Upper chest: The lung apices are grossly clear. Other: Non IMPRESSION: 1. No acute intracranial findings or skull fracture. 2. Paranasal sinus disease. 3. Normal alignment of the cervical spine without acute fracture. 4. Significant artifact related to anterior and interbody fusion hardware. Electronically Signed   By: Marijo Sanes M.D.   On: 12/18/2022 17:36    DG Pelvis  Portable  Result Date: 12/18/2022 CLINICAL DATA:  Fall EXAM: PORTABLE PELVIS 1-2 VIEWS COMPARISON:  None Available. FINDINGS: Subtle cortical irregularity of the left superior pubic ramus near the pubic root, equivocal for nondisplaced fracture. No abnormality is evident at the inferior pubic ramus. No pelvic diastasis. Bilateral hips appear intact without fracture or dislocation. IMPRESSION: Subtle cortical irregularity of the left superior pubic ramus near the pubic root, equivocal for nondisplaced fracture. Correlate with point tenderness. Electronically Signed   By: Duanne Guess D.O.   On: 12/18/2022 16:08   DG Chest Port 1 View  Result Date: 12/18/2022 CLINICAL DATA:  Fall EXAM: PORTABLE CHEST 1 VIEW COMPARISON:  03/31/2020 FINDINGS: Patient is rotated. The heart size and mediastinal contours are within normal limits. Lungs are clear. No pneumothorax. Acute mildly displaced fracture of the posterolateral left sixth rib. Nondisplaced fractures of the left seventh and eighth ribs. IMPRESSION: Acute fractures of the left sixth through eighth ribs. No pneumothorax. Electronically Signed   By: Duanne Guess D.O.   On: 12/18/2022 16:07    Positive ROS: All other systems have been reviewed and were otherwise negative with the exception of those mentioned in the HPI and as above.  Physical Exam: General: Alert and oriented x3. Patient in pain. Cardiovascular: No pedal edema Respiratory: No cyanosis, no use of accessory musculature GI: No organomegaly, abdomen is soft and non-tender Skin: No lesions in the area of chief complaint Neurologic: Sensation intact distally Psychiatric: Patient is competent for consent with normal mood and affect Lymphatic: No axillary or cervical lymphadenopathy  MUSCULOSKELETAL:   No skin wounds or lesions on the back. No pain with palpation over spinous processes. Patient only endorses rib pain.   Examination of the RUE benign no skin wounds or  lesions. LUE has bruising around elbow from fall.  No pain with palpation over bony prominences in UE bilaterally. No pain with passive or active ROM in shoulder, elbow, wrist, and hand bilaterally. Strength 5/5 shoulder, elbow, wrist, and hand bilaterally. Distal radial and ulnar pulses 2+ bilaterally. Sensation intact in UE bilaterally. Left shoulder has old [facility] joint separation confirmed by patient, no pain with palpation.   Examination of the RLE reveals no skin wounds or lesions. Pain with palpation only over the mid portion of the tibia over a bruise. He can perform a straight leg raise. No pain with active and passive ROM. Strength 5/5 in LE.   Examination of the LLE reveals no skin wounds or lesions. Pain only with palpation over the hip region. Pain with hip movement. No pain with movement or palpation over the knee, and ankle.   Sensory and motor function intact in LE bilaterally including plantar flexion, dorsiflexion, EHL. He can perform a SLR on the right, he has pain with SLR on the left. Distal pedal pulses 2+ bilaterally. Compartments soft bilaterally. No significant LE edema bilaterally.   Assessment: Left LC2 pelvis fracture  Multiple rib fractures.  Splenic laceration  Plan: I discussed the findings with the patient. Patient has been admitted with the trauma team. He is being taken today for IR embolization due to splenic laceration. He has pain with palpation over his right tibia, x-ray pending. Surgical intervention for LC2 pelvic fracture with Dr. Jena Gauss or Dr. Carola Frost tomorrow vs Friday pending patients medical stability. Patient to be NPO after midnight. Hold chemical DVT ppx. All questions solicited and answered.    Clois Dupes, PA-C    12/18/2022 7:55 PM

## 2022-12-18 NOTE — Consult Note (Signed)
Chief Complaint: Trauma, splenic injury with pseudoaneurym  Referring Physician(s): Dr Donell Beers, Trauma Service  Supervising Physician: Gilmer Mor  Patient Status: [facility] - ED  History of Present Illness: Brent Pittman is a 57 y.o. male presenting to the ED at [facility] as a trauma, fall from height.  Report that he fell from scaffolding.    CT imaging in the ED shows: Splenic injury with pseudoaneurysms and extrav Pelvic fracture with pelvic hematoma, no extrav Left rib fractures 4-9.  No PTX  Trauma feels splenectomy CI.  They have contacted [place] for eval for embolization.   He is in obvious pain on evaluation.  He states mostly left chest and hip pain.    Vitals:  BP: 124-137 / 83 - 91 HR: 72 - 102 O2: 96-97% RA   Labs: H&H: 12.3/37.3 Platelet: 183 BNP is WNL INR pending  Past Medical History:  Diagnosis Date   Arthritis     Past Surgical History:  Procedure Laterality Date   NECK SURGERY      Allergies: Patient has no known allergies.  Medications: Prior to Admission medications   Medication Sig Start Date End Date Taking? Authorizing Provider  azithromycin (ZITHROMAX Z-PAK) 250 MG tablet Take 2 tablets (500 mg) on  Day 1,  followed by 1 tablet (250 mg) once daily on Days 2 through 5. 03/31/20   Cuthriell, Christiane Ha D, PA-C  brompheniramine-pseudoephedrine-DM 30-2-10 MG/5ML syrup Take 10 mLs by mouth 4 (four) times daily as needed. 03/31/20   Cuthriell, Delorise Royals, PA-C  buprenorphine-naloxone (SUBOXONE) 8-2 mg SUBL SL tablet Place 2 tablets under the tongue daily. 11/23/22   [provider]  cephALEXin (KEFLEX) 500 MG capsule Take 1 capsule (500 mg total) by mouth 3 (three) times daily. 02/09/20   Fisher, Roselyn Bering, PA-C  gabapentin (NEURONTIN) 800 MG tablet Take 800 mg by mouth 2 (two) times daily.    [provider]  HYDROcodone-acetaminophen (NORCO/VICODIN) 5-325 MG tablet Take 1 tablet by mouth every 6 (six) hours as needed for moderate pain.  02/09/20   Fisher, Roselyn Bering, PA-C  ibuprofen (ADVIL) 800 MG tablet Take 800 mg by mouth every 8 (eight) hours as needed.    [provider]  predniSONE (DELTASONE) 20 MG tablet Take 2 tablets (40 mg total) by mouth daily. 01/13/20   Sharyn Creamer, MD     History reviewed. No pertinent family history.  Social History   Socioeconomic History   Marital status: Legally Separated    Spouse name: Not on file   Number of children: Not on file   Years of education: Not on file   Highest education level: Not on file  Occupational History   Not on file  Tobacco Use   Smoking status: Some Days    Types: Cigarettes   Smokeless tobacco: Current  Vaping Use   Vaping Use: Never used  Substance and Sexual Activity   Alcohol use: Yes   Drug use: Never   Sexual activity: Not on file  Other Topics Concern   Not on file  Social History Narrative   Not on file   Social Determinants of Health   Financial Resource Strain: Not on file  Food Insecurity: Not on file  Transportation Needs: Not on file  Physical Activity: Not on file  Stress: Not on file  Social Connections: Not on file       Review of Systems: A 12 point ROS discussed and pertinent positives are indicated in the HPI above.  All  other systems are negative.  Review of Systems  Vital Signs: BP 137/89   Pulse 100   Temp 98 F (36.7 C) (Oral)   Resp (!) 24   Ht 5\' 10"  (1.778 m)   Wt 77.1 kg   SpO2 99%   BMI 24.39 kg/m     Physical Exam General: 57 yo male appearing stated age.  Well-developed, well-nourished.  Obvious distress complaining of pain.  HEENT: Atraumatic, normocephalic.  No scleral icterus or scleral injection. No lesions on external ears, nose, lips, or gums.    Neck: Symmetric with no goiter enlargement.  Chest/Lungs:  Symmetric chest with inspiration/expiration.  No labored breathing.    Heart:   No JVD appreciated.  Abdomen:  diffuse TTP Genito-urinary: Deferred Neurologic: Alert & Oriented    Pulse Exam:  palpable distal pulses.     Imaging: CT CHEST ABDOMEN PELVIS W CONTRAST  Result Date: 12/18/2022 CLINICAL DATA:  7 foot fall off scaffolding. EXAM: CT CHEST, ABDOMEN, AND PELVIS WITH CONTRAST TECHNIQUE: Multidetector CT imaging of the chest, abdomen and pelvis was performed following the standard protocol during bolus administration of intravenous contrast. RADIATION DOSE REDUCTION: This exam was performed according to the departmental dose-optimization program which includes automated exposure control, adjustment of the mA and/or kV according to patient size and/or use of iterative reconstruction technique. CONTRAST:  4mL OMNIPAQUE IOHEXOL 350 MG/ML SOLN COMPARISON:  Chest x-ray earlier 12/18/2022. CT angiogram chest 01/13/2020 FINDINGS: CT CHEST FINDINGS Cardiovascular: Significant streak artifact along the low neck and upper chest from the patient's extensive fixation hardware in the cervical spine. Heart is nonenlarged. No pericardial effusion. Pulsation artifact along the ascending aorta but no mediastinal hematoma. Grossly normal caliber thoracic aorta. No dissection or aneurysm formation. Minimal atherosclerotic disease. The central pulmonary arteries are preserved. Mediastinum/Nodes: No specific abnormal lymph node enlargement seen in the axillary region, hilum or mediastinum. No pericardial effusion. Small hiatal hernia. Mild gynecomastia. Lungs/Pleura: Breathing motion identified. There is dependent areas of ground-glass bilaterally particularly along the lower lobes. Atelectasis is favored. There is some peripheral areas of interstitial septal thickening with scarring and fibrotic changes along the right side including towards the middle lobe. No clear pneumothorax. Small apical subpleural blebs identified. Additional areas left lower lobe. Likely small left pleural effusion and likely extrapleural hematoma posteroinferior. Musculoskeletal: Multiple posterior left-sided rib fractures  are identified. This includes the second, third, fourth, fifth, sixth, seventh, eighth, ninth ribs. No definite scapular fracture on the left. There several ribs which are fractured in more than 1 location. There are fractures of the lateral margin of the left fourth, fifth, sixth, seventh ribs. Please correlate for any flail chest physiology. CT ABDOMEN PELVIS FINDINGS Hepatobiliary: There are some small low-attenuation lesion seen in the liver consistent with benign cysts. Largest is seen in the dome in segment 4 measuring 15 mm in diameter. No specific imaging follow-up. No space-occupying liver lesion otherwise. Gallbladder is nondilated. Patent portal vein. Motion seen throughout the abdomen. Pancreas: Pancreas is grossly preserved without mass lesion or ductal dilatation. There is some subtle heterogeneous enhancement to the midportion of the spleen with some central areas of intraparenchymal active extravasation. Mild adjacent hematoma inferiorly along the splenic hilum. The splenic artery and vein are grossly enhancing. Spleen: Adrenal glands are preserved. No enhancing renal mass or collecting system dilatation. Adrenals/Urinary Tract: Preserved contours of the urinary bladder. Stomach/Bowel: Large bowel is of normal course and caliber with scattered stool. Overall moderate stool burden. Normal appendix extends inferior to the cecum  in the right lower quadrant. The stomach and small bowel are nondilated. Vascular/Lymphatic: Normal caliber aorta and IVC with scattered atherosclerotic calcifications. No specific abnormal lymph node enlargement identified in the abdomen and pelvis. Reproductive: Prostate is unremarkable. Other: No ascites or free air. Small fat containing umbilical hernia. Musculoskeletal: Multiple pelvic fractures are noted. There is subtle fracture of the left hemi sacral Alla. There also fractures of the posterior left iliac bone near the location of the sacroiliac joint. Left inferior and  superior pubic rami fractures are identified. There is some hematoma surrounding the pubic bone fractures including along the extraperitoneal space of the low pelvis. No active extravasation seen in this location. Critical Value/emergent results were called by telephone at the time of interpretation on 12/18/2022 at 5:30 pm to provider Lorre Nick , who verbally acknowledged these results. IMPRESSION: Heterogeneous spleen with a small amount of perisplenic hematoma but areas of intrasubstance active extravasation. Extensive left-sided rib fractures of the second through ninth ribs posteriorly. There are some additional fractures along the lateral margin of the left-sided ribcage involving ribs 4 through 7. Many of these are nondisplaced but please correlate with any clinical evidence of flail chest. There is a small extrapleural hematoma and some trace left-sided pleural fluid but less than 300 cc estimated. No pneumothorax. Multiple left-sided hemipelvic fractures involving the sacrum, iliac bone and pubic bones. Small extraperitoneal hematoma along the low anterior left hemipelvis. No clear active extravasation. No additional evidence of free air, free fluid or solid organ injury. Motion.  Artifacts. Electronically Signed   By: Karen Kays M.D.   On: 12/18/2022 17:38   CT Head Wo Contrast  Result Date: 12/18/2022 CLINICAL DATA:  Larey Seat. EXAM: CT HEAD WITHOUT CONTRAST CT CERVICAL SPINE WITHOUT CONTRAST TECHNIQUE: Multidetector CT imaging of the head and cervical spine was performed following the standard protocol without intravenous contrast. Multiplanar CT image reconstructions of the cervical spine were also generated. RADIATION DOSE REDUCTION: This exam was performed according to the departmental dose-optimization program which includes automated exposure control, adjustment of the mA and/or kV according to patient size and/or use of iterative reconstruction technique. COMPARISON:  None Available. FINDINGS:  CT HEAD FINDINGS Brain: No evidence of acute infarction, hemorrhage, hydrocephalus, extra-axial collection or mass lesion/mass effect. Vascular: Minimal vascular calcifications. No aneurysm or hyperdense vessels. Skull: No skull fracture or bone lesions. Sinuses/Orbits: Paranasal sinus disease most notably involving the left maxillary sinus and scattered ethmoid air cells. Moderate mucoperiosteal thickening in both halves of the sphenoid sinus. The mastoid air cells and middle ear cavities are clear. Other: No scalp lesions or scalp hematoma. CT CERVICAL SPINE FINDINGS Alignment: Normal Skull base and vertebrae: No obvious fracture. Significant artifact related to anterior and interbody fusion hardware. Soft tissues and spinal canal: No prevertebral fluid or swelling. No visible canal hematoma. Disc levels: The spinal canal is fairly generous. No canal stenosis. Upper chest: The lung apices are grossly clear. Other: Non IMPRESSION: 1. No acute intracranial findings or skull fracture. 2. Paranasal sinus disease. 3. Normal alignment of the cervical spine without acute fracture. 4. Significant artifact related to anterior and interbody fusion hardware. Electronically Signed   By: Rudie Meyer M.D.   On: 12/18/2022 17:36   CT Cervical Spine Wo Contrast  Result Date: 12/18/2022 CLINICAL DATA:  Larey Seat. EXAM: CT HEAD WITHOUT CONTRAST CT CERVICAL SPINE WITHOUT CONTRAST TECHNIQUE: Multidetector CT imaging of the head and cervical spine was performed following the standard protocol without intravenous contrast. Multiplanar CT image reconstructions of  the cervical spine were also generated. RADIATION DOSE REDUCTION: This exam was performed according to the departmental dose-optimization program which includes automated exposure control, adjustment of the mA and/or kV according to patient size and/or use of iterative reconstruction technique. COMPARISON:  None Available. FINDINGS: CT HEAD FINDINGS Brain: No evidence of acute  infarction, hemorrhage, hydrocephalus, extra-axial collection or mass lesion/mass effect. Vascular: Minimal vascular calcifications. No aneurysm or hyperdense vessels. Skull: No skull fracture or bone lesions. Sinuses/Orbits: Paranasal sinus disease most notably involving the left maxillary sinus and scattered ethmoid air cells. Moderate mucoperiosteal thickening in both halves of the sphenoid sinus. The mastoid air cells and middle ear cavities are clear. Other: No scalp lesions or scalp hematoma. CT CERVICAL SPINE FINDINGS Alignment: Normal Skull base and vertebrae: No obvious fracture. Significant artifact related to anterior and interbody fusion hardware. Soft tissues and spinal canal: No prevertebral fluid or swelling. No visible canal hematoma. Disc levels: The spinal canal is fairly generous. No canal stenosis. Upper chest: The lung apices are grossly clear. Other: Non IMPRESSION: 1. No acute intracranial findings or skull fracture. 2. Paranasal sinus disease. 3. Normal alignment of the cervical spine without acute fracture. 4. Significant artifact related to anterior and interbody fusion hardware. Electronically Signed   By: Rudie Meyer M.D.   On: 12/18/2022 17:36   DG Pelvis Portable  Result Date: 12/18/2022 CLINICAL DATA:  Fall EXAM: PORTABLE PELVIS 1-2 VIEWS COMPARISON:  None Available. FINDINGS: Subtle cortical irregularity of the left superior pubic ramus near the pubic root, equivocal for nondisplaced fracture. No abnormality is evident at the inferior pubic ramus. No pelvic diastasis. Bilateral hips appear intact without fracture or dislocation. IMPRESSION: Subtle cortical irregularity of the left superior pubic ramus near the pubic root, equivocal for nondisplaced fracture. Correlate with point tenderness. Electronically Signed   By: Duanne Guess D.O.   On: 12/18/2022 16:08   DG Chest Port 1 View  Result Date: 12/18/2022 CLINICAL DATA:  Fall EXAM: PORTABLE CHEST 1 VIEW COMPARISON:   03/31/2020 FINDINGS: Patient is rotated. The heart size and mediastinal contours are within normal limits. Lungs are clear. No pneumothorax. Acute mildly displaced fracture of the posterolateral left sixth rib. Nondisplaced fractures of the left seventh and eighth ribs. IMPRESSION: Acute fractures of the left sixth through eighth ribs. No pneumothorax. Electronically Signed   By: Duanne Guess D.O.   On: 12/18/2022 16:07    Labs:  CBC: Recent Labs    12/18/22 1535  WBC 8.6  HGB 12.3*  HCT 37.3*  PLT 183    COAGS: No results for input(s): "INR", "APTT" in the last 8760 hours.  BMP: Recent Labs    12/18/22 1535  NA 137  K 3.5  CL 104  CO2 24  GLUCOSE 92  BUN 17  CALCIUM 8.8*  CREATININE 1.19  GFRNONAA >60    LIVER FUNCTION TESTS: No results for input(s): "BILITOT", "AST", "ALT", "ALKPHOS", "PROT", "ALBUMIN" in the last 8760 hours.  TUMOR MARKERS: No results for input(s): "AFPTM", "CEA", "CA199", "CHROMGRNA" in the last 8760 hours.  Assessment and Plan:  Brent Pittman is 57 yo male with splenic injury after fall from height, with intraparenchymal hemorrhage and evidence of pseudoaneurysm, grade2/grade3.  He also has left pelvic fracture with pelvic hematoma, and no evidence of pelvic active extrav.   I had a discussion with Brent Pittman and his fiance regarding splenic injury and the basic paradigm for treatment.  While surgery sometimes plays a role, this injury pattern can be treated  with angiogram and possible embolization, which is reasonable given the pseudoaneurysm.   Risks and benefits of angiogram and possible emboliztation were discussed with the patient including, but not limited to bleeding, infection, vascular injury organ injury, need for further surgery/procedure, post embolization syndrome, infection, contrast induced renal failure, cardiopulmonary collapse, death.   This interventional procedure involves the use of X-rays and because of the nature of the planned  procedure, it is possible that we will have prolonged use of X-ray fluoroscopy.  Potential radiation risks to you include (but are not limited to) the following: - A slightly elevated risk for cancer  several years later in life. This risk is typically less than 0.5% percent. This risk is low in comparison to the normal incidence of human cancer, which is 33% for women and 50% for men according to the [place]. - Radiation induced injury can include skin redness, resembling a rash, tissue breakdown / ulcers and hair loss (which can be temporary or permanent).   The likelihood of either of these occurring depends on the difficulty of the procedure and whether you are sensitive to radiation due to previous procedures, disease, or genetic conditions.   IF your procedure requires a prolonged use of radiation, you will be notified and given written instructions for further action.  It is your responsibility to monitor the irradiated area for the 2 weeks following the procedure and to notify your physician if you are concerned that you have suffered a radiation induced injury.    All of the patient's questions were answered, patient is agreeable to proceed.  Consent signed and in chart.  Plan to proceed with angiogram possible embolization.     Thank you for this interesting consult.  I greatly enjoyed meeting Brent Pittman and look forward to participating in their care.  A copy of this report was sent to the requesting provider on this date.  Electronically Signed: Corrie Mckusick, DO 12/18/2022, 7:53 PM   I spent a total of 69 Miinutes    in face to face in clinical consultation, greater than 50% of which was counseling/coordinating care for angiogram, possible embolization

## 2022-12-18 NOTE — ED Triage Notes (Signed)
Pt brought in by EMS after a 42ft fall off of scaffolding. Denies blood thinners/LOC. Pain/deformities to L hip, L flank and L shoulder

## 2022-12-18 NOTE — H&P (Addendum)
History   Brent Pittman is an 57 y.o. male.   Chief Complaint:  Chief Complaint  Patient presents with   Fall    Pt sustained a fall today from scaffolding onto left side.  Height was around 7 feet.  He complains of left chest pain and pelvic pain.  Denies abdominal pain, n/v.  Denies LOC.  Also hit his right shin which is painful.      Past Medical History:  Diagnosis Date   Arthritis   H/o narcotic abuse.  Past Surgical History:  Procedure Laterality Date   NECK SURGERY      History reviewed. No pertinent family history. Social History:  reports that he has been smoking cigarettes. He uses smokeless tobacco. He reports current alcohol use. He reports that he does not use drugs.  Allergies  No Known Allergies  Home Medications  suboxone   Trauma Course   Results for orders placed or performed during the hospital encounter of 12/18/22 (from the past 48 hour(s))  CBC with Differential/Platelet     Status: Abnormal   Collection Time: 12/18/22  3:35 PM  Result Value Ref Range   WBC 8.6 4.0 - 10.5 K/uL   RBC 4.28 4.22 - 5.81 MIL/uL   Hemoglobin 12.3 (L) 13.0 - 17.0 g/dL   HCT 40.9 (L) 81.1 - 91.4 %   MCV 87.1 80.0 - 100.0 fL   MCH 28.7 26.0 - 34.0 pg   MCHC 33.0 30.0 - 36.0 g/dL   RDW 78.2 95.6 - 21.3 %   Platelets 183 150 - 400 K/uL   nRBC 0.0 0.0 - 0.2 %   Neutrophils Relative % 66 %   Neutro Abs 5.7 1.7 - 7.7 K/uL   Lymphocytes Relative 18 %   Lymphs Abs 1.6 0.7 - 4.0 K/uL   Monocytes Relative 9 %   Monocytes Absolute 0.8 0.1 - 1.0 K/uL   Eosinophils Relative 5 %   Eosinophils Absolute 0.4 0.0 - 0.5 K/uL   Basophils Relative 1 %   Basophils Absolute 0.1 0.0 - 0.1 K/uL   Immature Granulocytes 1 %   Abs Immature Granulocytes 0.06 0.00 - 0.07 K/uL    Comment: Performed at [facility], [address], [place], [place] [address]  Basic metabolic panel     Status: Abnormal   Collection Time: 12/18/22  3:35 PM  Result Value Ref Range   Sodium 137 135 -  145 mmol/L   Potassium 3.5 3.5 - 5.1 mmol/L   Chloride 104 98 - 111 mmol/L   CO2 24 22 - 32 mmol/L   Glucose, Bld 92 70 - 99 mg/dL    Comment: Glucose reference range applies only to samples taken after fasting for at least 8 hours.   BUN 17 6 - 20 mg/dL   Creatinine, Ser 8.46 0.61 - 1.24 mg/dL   Calcium 8.8 (L) 8.9 - 10.3 mg/dL   GFR, Estimated >96 >29 mL/min    Comment: (NOTE) Calculated using the CKD-EPI Creatinine Equation (2021)    Anion gap 9 5 - 15    Comment: Performed at [facility], [address], [place], [place] [address]  Type and screen     Status: None   Collection Time: 12/18/22  3:35 PM  Result Value Ref Range   ABO/RH(D) O POS    Antibody Screen NEG    Sample Expiration      12/21/2022,2359 Performed at [facility], [address] [place], [place]  [address]    CT CHEST ABDOMEN PELVIS W CONTRAST  Result Date: 12/18/2022 CLINICAL DATA:  7 foot fall off scaffolding. EXAM: CT CHEST, ABDOMEN, AND PELVIS WITH CONTRAST TECHNIQUE: Multidetector CT imaging of the chest, abdomen and pelvis was performed following the standard protocol during bolus administration of intravenous contrast. RADIATION DOSE REDUCTION: This exam was performed according to the departmental dose-optimization program which includes automated exposure control, adjustment of the mA and/or kV according to patient size and/or use of iterative reconstruction technique. CONTRAST:  6mL OMNIPAQUE IOHEXOL 350 MG/ML SOLN COMPARISON:  Chest x-ray earlier 12/18/2022. CT angiogram chest 01/13/2020 FINDINGS: CT CHEST FINDINGS Cardiovascular: Significant streak artifact along the low neck and upper chest from the patient's extensive fixation hardware in the cervical spine. Heart is nonenlarged. No pericardial effusion. Pulsation artifact along the ascending aorta but no mediastinal hematoma. Grossly normal caliber thoracic aorta. No dissection or aneurysm formation. Minimal atherosclerotic disease. The  central pulmonary arteries are preserved. Mediastinum/Nodes: No specific abnormal lymph node enlargement seen in the axillary region, hilum or mediastinum. No pericardial effusion. Small hiatal hernia. Mild gynecomastia. Lungs/Pleura: Breathing motion identified. There is dependent areas of ground-glass bilaterally particularly along the lower lobes. Atelectasis is favored. There is some peripheral areas of interstitial septal thickening with scarring and fibrotic changes along the right side including towards the middle lobe. No clear pneumothorax. Small apical subpleural blebs identified. Additional areas left lower lobe. Likely small left pleural effusion and likely extrapleural hematoma posteroinferior. Musculoskeletal: Multiple posterior left-sided rib fractures are identified. This includes the second, third, fourth, fifth, sixth, seventh, eighth, ninth ribs. No definite scapular fracture on the left. There several ribs which are fractured in more than 1 location. There are fractures of the lateral margin of the left fourth, fifth, sixth, seventh ribs. Please correlate for any flail chest physiology. CT ABDOMEN PELVIS FINDINGS Hepatobiliary: There are some small low-attenuation lesion seen in the liver consistent with benign cysts. Largest is seen in the dome in segment 4 measuring 15 mm in diameter. No specific imaging follow-up. No space-occupying liver lesion otherwise. Gallbladder is nondilated. Patent portal vein. Motion seen throughout the abdomen. Pancreas: Pancreas is grossly preserved without mass lesion or ductal dilatation. There is some subtle heterogeneous enhancement to the midportion of the spleen with some central areas of intraparenchymal active extravasation. Mild adjacent hematoma inferiorly along the splenic hilum. The splenic artery and vein are grossly enhancing. Spleen: Adrenal glands are preserved. No enhancing renal mass or collecting system dilatation. Adrenals/Urinary Tract:  Preserved contours of the urinary bladder. Stomach/Bowel: Large bowel is of normal course and caliber with scattered stool. Overall moderate stool burden. Normal appendix extends inferior to the cecum in the right lower quadrant. The stomach and small bowel are nondilated. Vascular/Lymphatic: Normal caliber aorta and IVC with scattered atherosclerotic calcifications. No specific abnormal lymph node enlargement identified in the abdomen and pelvis. Reproductive: Prostate is unremarkable. Other: No ascites or free air. Small fat containing umbilical hernia. Musculoskeletal: Multiple pelvic fractures are noted. There is subtle fracture of the left hemi sacral Alla. There also fractures of the posterior left iliac bone near the location of the sacroiliac joint. Left inferior and superior pubic rami fractures are identified. There is some hematoma surrounding the pubic bone fractures including along the extraperitoneal space of the low pelvis. No active extravasation seen in this location. Critical Value/emergent results were called by telephone at the time of interpretation on 12/18/2022 at 5:30 pm to provider Lacretia Leigh , who verbally acknowledged these results.  IMPRESSION: Heterogeneous spleen with a small amount of perisplenic hematoma but areas of intrasubstance active extravasation. Extensive left-sided rib fractures of the second through ninth ribs posteriorly. There are some additional fractures along the lateral margin of the left-sided ribcage involving ribs 4 through 7. Many of these are nondisplaced but please correlate with any clinical evidence of flail chest. There is a small extrapleural hematoma and some trace left-sided pleural fluid but less than 300 cc estimated. No pneumothorax. Multiple left-sided hemipelvic fractures involving the sacrum, iliac bone and pubic bones. Small extraperitoneal hematoma along the low anterior left hemipelvis. No clear active extravasation. No additional evidence of free  air, free fluid or solid organ injury. Motion.  Artifacts. Electronically Signed   By: Karen Kays M.D.   On: 12/18/2022 17:38   CT Head Wo Contrast  Result Date: 12/18/2022 CLINICAL DATA:  Larey Seat. EXAM: CT HEAD WITHOUT CONTRAST CT CERVICAL SPINE WITHOUT CONTRAST TECHNIQUE: Multidetector CT imaging of the head and cervical spine was performed following the standard protocol without intravenous contrast. Multiplanar CT image reconstructions of the cervical spine were also generated. RADIATION DOSE REDUCTION: This exam was performed according to the departmental dose-optimization program which includes automated exposure control, adjustment of the mA and/or kV according to patient size and/or use of iterative reconstruction technique. COMPARISON:  None Available. FINDINGS: CT HEAD FINDINGS Brain: No evidence of acute infarction, hemorrhage, hydrocephalus, extra-axial collection or mass lesion/mass effect. Vascular: Minimal vascular calcifications. No aneurysm or hyperdense vessels. Skull: No skull fracture or bone lesions. Sinuses/Orbits: Paranasal sinus disease most notably involving the left maxillary sinus and scattered ethmoid air cells. Moderate mucoperiosteal thickening in both halves of the sphenoid sinus. The mastoid air cells and middle ear cavities are clear. Other: No scalp lesions or scalp hematoma. CT CERVICAL SPINE FINDINGS Alignment: Normal Skull base and vertebrae: No obvious fracture. Significant artifact related to anterior and interbody fusion hardware. Soft tissues and spinal canal: No prevertebral fluid or swelling. No visible canal hematoma. Disc levels: The spinal canal is fairly generous. No canal stenosis. Upper chest: The lung apices are grossly clear. Other: Non IMPRESSION: 1. No acute intracranial findings or skull fracture. 2. Paranasal sinus disease. 3. Normal alignment of the cervical spine without acute fracture. 4. Significant artifact related to anterior and interbody fusion  hardware. Electronically Signed   By: Rudie Meyer M.D.   On: 12/18/2022 17:36   CT Cervical Spine Wo Contrast  Result Date: 12/18/2022 CLINICAL DATA:  Larey Seat. EXAM: CT HEAD WITHOUT CONTRAST CT CERVICAL SPINE WITHOUT CONTRAST TECHNIQUE: Multidetector CT imaging of the head and cervical spine was performed following the standard protocol without intravenous contrast. Multiplanar CT image reconstructions of the cervical spine were also generated. RADIATION DOSE REDUCTION: This exam was performed according to the departmental dose-optimization program which includes automated exposure control, adjustment of the mA and/or kV according to patient size and/or use of iterative reconstruction technique. COMPARISON:  None Available. FINDINGS: CT HEAD FINDINGS Brain: No evidence of acute infarction, hemorrhage, hydrocephalus, extra-axial collection or mass lesion/mass effect. Vascular: Minimal vascular calcifications. No aneurysm or hyperdense vessels. Skull: No skull fracture or bone lesions. Sinuses/Orbits: Paranasal sinus disease most notably involving the left maxillary sinus and scattered ethmoid air cells. Moderate mucoperiosteal thickening in both halves of the sphenoid sinus. The mastoid air cells and middle ear cavities are clear. Other: No scalp lesions or scalp hematoma. CT CERVICAL SPINE FINDINGS Alignment: Normal Skull base and vertebrae: No obvious fracture. Significant artifact related to anterior and interbody fusion  hardware. Soft tissues and spinal canal: No prevertebral fluid or swelling. No visible canal hematoma. Disc levels: The spinal canal is fairly generous. No canal stenosis. Upper chest: The lung apices are grossly clear. Other: Non IMPRESSION: 1. No acute intracranial findings or skull fracture. 2. Paranasal sinus disease. 3. Normal alignment of the cervical spine without acute fracture. 4. Significant artifact related to anterior and interbody fusion hardware. Electronically Signed   By: Marijo Sanes M.D.   On: 12/18/2022 17:36   DG Pelvis Portable  Result Date: 12/18/2022 CLINICAL DATA:  Fall EXAM: PORTABLE PELVIS 1-2 VIEWS COMPARISON:  None Available. FINDINGS: Subtle cortical irregularity of the left superior pubic ramus near the pubic root, equivocal for nondisplaced fracture. No abnormality is evident at the inferior pubic ramus. No pelvic diastasis. Bilateral hips appear intact without fracture or dislocation. IMPRESSION: Subtle cortical irregularity of the left superior pubic ramus near the pubic root, equivocal for nondisplaced fracture. Correlate with point tenderness. Electronically Signed   By: Davina Poke D.O.   On: 12/18/2022 16:08   DG Chest Port 1 View  Result Date: 12/18/2022 CLINICAL DATA:  Fall EXAM: PORTABLE CHEST 1 VIEW COMPARISON:  03/31/2020 FINDINGS: Patient is rotated. The heart size and mediastinal contours are within normal limits. Lungs are clear. No pneumothorax. Acute mildly displaced fracture of the posterolateral left sixth rib. Nondisplaced fractures of the left seventh and eighth ribs. IMPRESSION: Acute fractures of the left sixth through eighth ribs. No pneumothorax. Electronically Signed   By: Davina Poke D.O.   On: 12/18/2022 16:07    Review of Systems  All other systems reviewed and are negative.   Blood pressure 124/83, pulse 86, temperature 98.1 F (36.7 C), temperature source Oral, resp. rate 18, height 5\' 10"  (1.778 m), weight 77.1 kg, SpO2 96 %. Physical Exam Vitals reviewed.  Constitutional:      General: He is in acute distress (looks uncomfortable).     Appearance: Normal appearance. He is not ill-appearing or diaphoretic.  HENT:     Head: Normocephalic and atraumatic.     Right Ear: External ear normal.     Left Ear: External ear normal.     Mouth/Throat:     Mouth: Mucous membranes are moist.     Pharynx: No oropharyngeal exudate or posterior oropharyngeal erythema.  Eyes:     General: No scleral icterus.     Extraocular Movements: Extraocular movements intact.     Conjunctiva/sclera: Conjunctivae normal.     Pupils: Pupils are equal, round, and reactive to light.  Cardiovascular:     Rate and Rhythm: Regular rhythm. Tachycardia present.     Pulses: Normal pulses.     Heart sounds: Normal heart sounds. No murmur heard. Pulmonary:     Effort: Pulmonary effort is normal. No respiratory distress.     Breath sounds: Normal breath sounds. No stridor.  Chest:     Chest wall: Tenderness present.  Abdominal:     General: Abdomen is flat. Bowel sounds are normal. There is no distension.     Palpations: Abdomen is soft.     Tenderness: There is no abdominal tenderness. There is no guarding or rebound.     Hernia: No hernia is present.  Musculoskeletal:        General: Tenderness (left pelvis and right shin.) present. No swelling.     Cervical back: Normal range of motion and neck supple. No rigidity or tenderness.  Skin:    General: Skin is warm and dry.  Capillary Refill: Capillary refill takes 2 to 3 seconds.     Coloration: Skin is not jaundiced or pale.     Findings: No erythema.  Neurological:     General: No focal deficit present.     Mental Status: He is alert and oriented to person, place, and time.     Motor: No weakness.     Coordination: Coordination normal.  Psychiatric:        Mood and Affect: Mood normal.        Behavior: Behavior normal.        Thought Content: Thought content normal.        Judgment: Judgment normal.     Assessment/Plan Fall Splenic laceration with active extravasation Left posterior 2-9 rib fractures and lateral fx 4-7 on left.   Left sacrum, iliac and pubic fx.   ABL anemia Right lower leg pain over tibia  Requested IR embolization. (Spoke with [facility]) Ortho consult (ED spoke with [facility]) Admit for pain control ICU admit given splenic lac. Pulmonary toilet.  Will need therapies once weight bearing status clear and once off bed rest.   Pain  films right lower leg.    Almond Lint 12/18/2022, 6:38 PM   Procedures

## 2022-12-19 ENCOUNTER — Encounter (HOSPITAL_COMMUNITY): Payer: Self-pay

## 2022-12-19 ENCOUNTER — Inpatient Hospital Stay (HOSPITAL_COMMUNITY): Payer: Medicaid Other

## 2022-12-19 ENCOUNTER — Encounter (HOSPITAL_COMMUNITY): Admission: EM | Disposition: A | Discharge status: Home Health | Payer: Self-pay | Source: Home / Self Care

## 2022-12-19 ENCOUNTER — Other Ambulatory Visit: Payer: Self-pay

## 2022-12-19 ENCOUNTER — Inpatient Hospital Stay (HOSPITAL_COMMUNITY): Payer: Medicaid Other | Admitting: Certified Registered"

## 2022-12-19 DIAGNOSIS — S32810A Multiple fractures of pelvis with stable disruption of pelvic ring, initial encounter for closed fracture: Secondary | ICD-10-CM | POA: Diagnosis not present

## 2022-12-19 HISTORY — PX: SACRO-ILIAC PINNING: SHX5050

## 2022-12-19 LAB — COMPREHENSIVE METABOLIC PANEL
ALT: 18 U/L (ref 0–44)
AST: 29 U/L (ref 15–41)
Albumin: 3.2 g/dL — ABNORMAL LOW (ref 3.5–5.0)
Alkaline Phosphatase: 44 U/L (ref 38–126)
Anion gap: 7 (ref 5–15)
BUN: 12 mg/dL (ref 6–20)
CO2: 25 mmol/L (ref 22–32)
Calcium: 8.3 mg/dL — ABNORMAL LOW (ref 8.9–10.3)
Chloride: 102 mmol/L (ref 98–111)
Creatinine, Ser: 1.06 mg/dL (ref 0.61–1.24)
GFR, Estimated: >60 mL/min (ref >60)
Glucose, Bld: 132 mg/dL — ABNORMAL HIGH (ref 70–99)
Potassium: 4.1 mmol/L (ref 3.5–5.1)
Sodium: 134 mmol/L — ABNORMAL LOW (ref 135–145)
Total Bilirubin: 0.6 mg/dL (ref 0.3–1.2)
Total Protein: 5.7 g/dL — ABNORMAL LOW (ref 6.5–8.1)

## 2022-12-19 LAB — CBC
HCT: 31 % — ABNORMAL LOW (ref 39.0–52.0)
HCT: 19.9 % — ABNORMAL LOW (ref 39.0–52.0)
HCT: 27.5 % — ABNORMAL LOW (ref 39.0–52.0)
Hemoglobin: 10.8 g/dL — ABNORMAL LOW (ref 13.0–17.0)
Hemoglobin: 5.5 g/dL — CL (ref 13.0–17.0)
Hemoglobin: 9.2 g/dL — ABNORMAL LOW (ref 13.0–17.0)
MCH: 29.3 pg (ref 26.0–34.0)
MCH: 29.7 pg (ref 26.0–34.0)
MCH: 28.8 pg (ref 26.0–34.0)
MCHC: 34.8 g/dL (ref 30.0–36.0)
MCHC: 27.6 g/dL — ABNORMAL LOW (ref 30.0–36.0)
MCHC: 33.5 g/dL (ref 30.0–36.0)
MCV: 84 fL (ref 80.0–100.0)
MCV: 107.6 fL — ABNORMAL HIGH (ref 80.0–100.0)
MCV: 86.2 fL (ref 80.0–100.0)
Platelets: 173 10*3/uL (ref 150–400)
Platelets: 87 10*3/uL — ABNORMAL LOW (ref 150–400)
Platelets: 151 10*3/uL (ref 150–400)
RBC: 3.69 MIL/uL — ABNORMAL LOW (ref 4.22–5.81)
RBC: 1.85 MIL/uL — ABNORMAL LOW (ref 4.22–5.81)
RBC: 3.19 MIL/uL — ABNORMAL LOW (ref 4.22–5.81)
RDW: 13.2 % (ref 11.5–15.5)
RDW: 14 % (ref 11.5–15.5)
RDW: 13.2 % (ref 11.5–15.5)
WBC: 10.3 10*3/uL (ref 4.0–10.5)
WBC: 6.2 10*3/uL (ref 4.0–10.5)
WBC: 11 10*3/uL — ABNORMAL HIGH (ref 4.0–10.5)
nRBC: 0 % (ref 0.0–0.2)
nRBC: 0 % (ref 0.0–0.2)
nRBC: 0 % (ref 0.0–0.2)

## 2022-12-19 LAB — HIV ANTIBODY (ROUTINE TESTING W REFLEX): HIV Screen 4th Generation wRfx: NONREACTIVE

## 2022-12-19 LAB — PROTIME-INR
INR: 1.1 (ref 0.8–1.2)
Prothrombin Time: 13.7 seconds (ref 11.4–15.2)

## 2022-12-19 LAB — MRSA NEXT GEN BY PCR, NASAL: MRSA by PCR Next Gen: DETECTED — AB

## 2022-12-19 SURGERY — PINNING, SACROILIAC JOINT, PERCUTANEOUS
Anesthesia: General | Site: Pelvis | Laterality: Left

## 2022-12-19 MED ORDER — FENTANYL CITRATE (PF) 250 MCG/5ML IJ SOLN
INTRAMUSCULAR | Status: AC
  Filled 2022-12-19: qty 5

## 2022-12-19 MED ORDER — METHOCARBAMOL 1000 MG/10ML IJ SOLN: 1000.0000 mg | Freq: Three times a day (TID) | INTRAVENOUS | Status: DC | PRN

## 2022-12-19 MED ORDER — METHOCARBAMOL 500 MG PO TABS: 1000.0000 mg | ORAL_TABLET | Freq: Three times a day (TID) | ORAL | Status: DC | PRN

## 2022-12-19 MED ORDER — ROCURONIUM BROMIDE 10 MG/ML (PF) SYRINGE
PREFILLED_SYRINGE | INTRAVENOUS | Status: DC | PRN
  Administered 2022-12-19: 100 mg via INTRAVENOUS

## 2022-12-19 MED ORDER — EPHEDRINE 5 MG/ML INJ
INTRAVENOUS | Status: AC
  Filled 2022-12-19: qty 5

## 2022-12-19 MED ORDER — MUPIROCIN 2 % EX OINT
1.0000 | TOPICAL_OINTMENT | Freq: Two times a day (BID) | CUTANEOUS | Status: AC
  Administered 2022-12-19 – 2022-12-23 (×10): 1 via NASAL
  Filled 2022-12-19 (×4): qty 22

## 2022-12-19 MED ORDER — DEXAMETHASONE SODIUM PHOSPHATE 10 MG/ML IJ SOLN
INTRAMUSCULAR | Status: AC
  Filled 2022-12-19: qty 1

## 2022-12-19 MED ORDER — 0.9 % SODIUM CHLORIDE (POUR BTL) OPTIME
TOPICAL | Status: DC | PRN
  Administered 2022-12-19: 1000 mL

## 2022-12-19 MED ORDER — PROPOFOL 10 MG/ML IV BOLUS
INTRAVENOUS | Status: DC | PRN
  Administered 2022-12-19: 150 mg via INTRAVENOUS

## 2022-12-19 MED ORDER — DEXAMETHASONE SODIUM PHOSPHATE 10 MG/ML IJ SOLN
INTRAMUSCULAR | Status: DC | PRN
  Administered 2022-12-19: 10 mg via INTRAVENOUS

## 2022-12-19 MED ORDER — SUGAMMADEX SODIUM 200 MG/2ML IV SOLN
INTRAVENOUS | Status: DC | PRN
  Administered 2022-12-19: 200 mg via INTRAVENOUS

## 2022-12-19 MED ORDER — ACETAMINOPHEN 500 MG PO TABS
1000.0000 mg | ORAL_TABLET | Freq: Once | ORAL | Status: AC
  Administered 2022-12-19: 1000 mg via ORAL
  Filled 2022-12-19: qty 2

## 2022-12-19 MED ORDER — ORAL CARE MOUTH RINSE: 15.0000 mL | Freq: Once | OROMUCOSAL | Status: AC

## 2022-12-19 MED ORDER — LACTATED RINGERS IV SOLN: INTRAVENOUS | Status: DC

## 2022-12-19 MED ORDER — CHLORHEXIDINE GLUCONATE CLOTH 2 % EX PADS
6.0000 | MEDICATED_PAD | Freq: Every day | CUTANEOUS | Status: AC
  Administered 2022-12-19 – 2022-12-22 (×4): 6 via TOPICAL

## 2022-12-19 MED ORDER — VANCOMYCIN HCL 1000 MG/200ML IV SOLN
1000.0000 mg | INTRAVENOUS | Status: DC
  Filled 2022-12-19: qty 200

## 2022-12-19 MED ORDER — HYDROMORPHONE HCL 1 MG/ML IJ SOLN
1.0000 mg | INTRAMUSCULAR | Status: DC | PRN
  Administered 2022-12-19 (×2): 1 mg via INTRAVENOUS
  Filled 2022-12-19: qty 1

## 2022-12-19 MED ORDER — CHLORHEXIDINE GLUCONATE 0.12 % MT SOLN: 15.0000 mL | Freq: Once | OROMUCOSAL | Status: AC

## 2022-12-19 MED ORDER — PHENYLEPHRINE HCL-NACL 20-0.9 MG/250ML-% IV SOLN
INTRAVENOUS | Status: DC | PRN
  Administered 2022-12-19: 50 ug/min via INTRAVENOUS

## 2022-12-19 MED ORDER — ALBUMIN HUMAN 5 % IV SOLN: INTRAVENOUS | Status: DC | PRN

## 2022-12-19 MED ORDER — CHLORHEXIDINE GLUCONATE 0.12 % MT SOLN
OROMUCOSAL | Status: AC
  Administered 2022-12-19: 15 mL via OROMUCOSAL
  Filled 2022-12-19: qty 15

## 2022-12-19 MED ORDER — ONDANSETRON HCL 4 MG/2ML IJ SOLN
INTRAMUSCULAR | Status: AC
  Filled 2022-12-19: qty 2

## 2022-12-19 MED ORDER — EPHEDRINE SULFATE-NACL 50-0.9 MG/10ML-% IV SOSY
PREFILLED_SYRINGE | INTRAVENOUS | Status: DC | PRN
  Administered 2022-12-19: 5 mg via INTRAVENOUS

## 2022-12-19 MED ORDER — FENTANYL CITRATE (PF) 100 MCG/2ML IJ SOLN: 25.0000 ug | INTRAMUSCULAR | Status: DC | PRN

## 2022-12-19 MED ORDER — ROCURONIUM BROMIDE 10 MG/ML (PF) SYRINGE
PREFILLED_SYRINGE | INTRAVENOUS | Status: AC
  Filled 2022-12-19: qty 10

## 2022-12-19 MED ORDER — FENTANYL CITRATE (PF) 250 MCG/5ML IJ SOLN
INTRAMUSCULAR | Status: DC | PRN
  Administered 2022-12-19: 100 ug via INTRAVENOUS

## 2022-12-19 MED ORDER — LIDOCAINE 2% (20 MG/ML) 5 ML SYRINGE
INTRAMUSCULAR | Status: DC | PRN
  Administered 2022-12-19: 60 mg via INTRAVENOUS

## 2022-12-19 MED ORDER — LIDOCAINE 2% (20 MG/ML) 5 ML SYRINGE
INTRAMUSCULAR | Status: AC
  Filled 2022-12-19: qty 5

## 2022-12-19 MED ORDER — KETAMINE HCL 50 MG/5ML IJ SOSY
PREFILLED_SYRINGE | INTRAMUSCULAR | Status: AC
  Filled 2022-12-19: qty 5

## 2022-12-19 MED ORDER — PHENYLEPHRINE 80 MCG/ML (10ML) SYRINGE FOR IV PUSH (FOR BLOOD PRESSURE SUPPORT)
PREFILLED_SYRINGE | INTRAVENOUS | Status: AC
  Filled 2022-12-19: qty 10

## 2022-12-19 MED ORDER — VANCOMYCIN HCL 1000 MG IV SOLR
1000.0000 mg | Freq: Once | INTRAVENOUS | Status: AC
  Administered 2022-12-19: 1000 mg via INTRAVENOUS
  Filled 2022-12-19: qty 20

## 2022-12-19 MED ORDER — MIDAZOLAM HCL 2 MG/2ML IJ SOLN
INTRAMUSCULAR | Status: AC
  Filled 2022-12-19: qty 2

## 2022-12-19 MED ORDER — PHENYLEPHRINE 80 MCG/ML (10ML) SYRINGE FOR IV PUSH (FOR BLOOD PRESSURE SUPPORT)
PREFILLED_SYRINGE | INTRAVENOUS | Status: DC | PRN
  Administered 2022-12-19 (×2): 160 ug via INTRAVENOUS

## 2022-12-19 MED ORDER — OXYCODONE HCL 5 MG PO TABS
5.0000 mg | ORAL_TABLET | ORAL | Status: DC | PRN
  Administered 2022-12-19 – 2022-12-20 (×3): 15 mg via ORAL
  Filled 2022-12-19: qty 4
  Filled 2022-12-19 (×3): qty 3

## 2022-12-19 MED ORDER — ONDANSETRON HCL 4 MG/2ML IJ SOLN
INTRAMUSCULAR | Status: DC | PRN
  Administered 2022-12-19: 4 mg via INTRAVENOUS

## 2022-12-19 MED ORDER — VANCOMYCIN HCL IN DEXTROSE 1-5 GM/200ML-% IV SOLN
1000.0000 mg | Freq: Once | INTRAVENOUS | Status: AC
  Filled 2022-12-19: qty 200

## 2022-12-19 MED ORDER — CEFAZOLIN SODIUM-DEXTROSE 2-4 GM/100ML-% IV SOLN
2.0000 g | Freq: Three times a day (TID) | INTRAVENOUS | Status: AC
  Administered 2022-12-19 – 2022-12-20 (×3): 2 g via INTRAVENOUS
  Filled 2022-12-19 (×3): qty 100

## 2022-12-19 MED ORDER — KETAMINE HCL 10 MG/ML IJ SOLN
INTRAMUSCULAR | Status: DC | PRN
  Administered 2022-12-19: 25 mg via INTRAVENOUS

## 2022-12-19 SURGICAL SUPPLY — 40 items
BAG COUNTER SPONGE SURGICOUNT (BAG) ×1 IMPLANT
BIT DRILL 4.8X300 (BIT) IMPLANT
BRUSH SCRUB EZ PLAIN DRY (MISCELLANEOUS) ×2 IMPLANT
COVER SURGICAL LIGHT HANDLE (MISCELLANEOUS) ×1 IMPLANT
DRAPE C-ARM 42X72 X-RAY (DRAPES) IMPLANT
DRAPE C-ARMOR (DRAPES) ×1 IMPLANT
DRAPE INCISE IOBAN 66X45 STRL (DRAPES) ×1 IMPLANT
DRAPE LAPAROTOMY TRNSV 102X78 (DRAPES) ×1 IMPLANT
DRAPE U-SHAPE 47X51 STRL (DRAPES) ×1 IMPLANT
DRESSING MEPILEX FLEX 4X4 (GAUZE/BANDAGES/DRESSINGS) IMPLANT
DRSG MEPILEX BORDER 4X4 (GAUZE/BANDAGES/DRESSINGS) ×1 IMPLANT
DRSG MEPILEX FLEX 4X4 (GAUZE/BANDAGES/DRESSINGS) ×1
ELECT REM PT RETURN 9FT ADLT (ELECTROSURGICAL) ×1
ELECTRODE REM PT RTRN 9FT ADLT (ELECTROSURGICAL) ×1 IMPLANT
GLOVE BIO SURGEON STRL SZ7.5 (GLOVE) ×1 IMPLANT
GLOVE BIO SURGEON STRL SZ8 (GLOVE) ×1 IMPLANT
GLOVE BIOGEL PI IND STRL 7.5 (GLOVE) ×1 IMPLANT
GLOVE BIOGEL PI IND STRL 8 (GLOVE) ×1 IMPLANT
GLOVE SURG ORTHO LTX SZ7.5 (GLOVE) ×2 IMPLANT
GOWN STRL REUS W/ TWL LRG LVL3 (GOWN DISPOSABLE) ×2 IMPLANT
GOWN STRL REUS W/ TWL XL LVL3 (GOWN DISPOSABLE) ×1 IMPLANT
GOWN STRL REUS W/TWL LRG LVL3 (GOWN DISPOSABLE) ×2
GOWN STRL REUS W/TWL XL LVL3 (GOWN DISPOSABLE) ×1
KIT BASIN OR (CUSTOM PROCEDURE TRAY) ×1 IMPLANT
KIT TURNOVER KIT B (KITS) ×1 IMPLANT
MANIFOLD NEPTUNE II (INSTRUMENTS) ×1 IMPLANT
NS IRRIG 1000ML POUR BTL (IV SOLUTION) ×1 IMPLANT
PACK GENERAL/GYN (CUSTOM PROCEDURE TRAY) ×1 IMPLANT
PAD ARMBOARD 7.5X6 YLW CONV (MISCELLANEOUS) ×2 IMPLANT
PIN GUIDE DRIL TIP 2.8X300 STE (PIN) IMPLANT
SCREW CANN 8.0X150X40 (Screw) IMPLANT
SCREW CANN 8.0X170X40 (Screw) IMPLANT
STAPLER VISISTAT 35W (STAPLE) ×1 IMPLANT
SUT ETHILON 2 0 FS 18 (SUTURE) ×1 IMPLANT
SUT ETHILON 2 0 PSLX (SUTURE) IMPLANT
SUT VIC AB 2-0 FS1 27 (SUTURE) ×1 IMPLANT
TOWEL GREEN STERILE (TOWEL DISPOSABLE) ×2 IMPLANT
TOWEL GREEN STERILE FF (TOWEL DISPOSABLE) ×1 IMPLANT
UNDERPAD 30X36 HEAVY ABSORB (UNDERPADS AND DIAPERS) ×1 IMPLANT
WATER STERILE IRR 1000ML POUR (IV SOLUTION) ×1 IMPLANT

## 2022-12-19 NOTE — Transfer of Care (Signed)
Immediate Anesthesia Transfer of Care Note  Patient: Brent Pittman  Procedure(s) Performed: SACROILIAC SCREW FIXATION OF PELVIS, LEFT TO RIGHT (Left: Pelvis)  Patient Location: PACU  Anesthesia Type:General  Level of Consciousness: sedated  Airway & Oxygen Therapy: Patient Spontanous Breathing and Patient connected to nasal cannula oxygen  Post-op Assessment: Report given to RN and Post -op Vital signs reviewed and stable  Post vital signs: Reviewed and stable  Last Vitals:  Vitals Value Taken Time  BP 100/64 12/19/22 1618  Temp    Pulse 66 12/19/22 1620  Resp 15 12/19/22 1620  SpO2 95 % 12/19/22 1620  Vitals shown include unvalidated device data.  Last Pain:  Vitals:   12/19/22 1233  TempSrc:   PainSc: 8       Patients Stated Pain Goal: 0 (50/27/74 1287)  Complications: No notable events documented.

## 2022-12-19 NOTE — Progress Notes (Signed)
Patient ID: Brent Pittman, male   DOB: 1966-05-16, 57 y.o.   MRN: [id number] Follow up - Trauma Critical Care   Patient Details:    Brent Pittman is an 57 y.o. male.  Lines/tubes : External Urinary Catheter (Active)  Collection Container Standard drainage bag 12/19/22 0800  Suction (Verified suction is between 40-80 mmHg) N/A (Patient has condom catheter) 12/19/22 0800  Securement Method None needed 12/19/22 0800  Site Assessment Clean, Dry, Intact 12/19/22 0800  Intervention Male External Urinary Catheter Replaced 12/19/22 0800  Output (mL) 100 mL 12/19/22 0600    Microbiology/Sepsis markers: Results for orders placed or performed during the hospital encounter of 12/18/22  MRSA Next Gen by PCR, Nasal     Status: Abnormal   Collection Time: 12/18/22  9:27 PM   Specimen: Nasal Mucosa; Nasal Swab  Result Value Ref Range Status   MRSA by PCR Next Gen DETECTED (A) NOT DETECTED Final    Comment: RESULT CALLED TO, READ BACK BY AND VERIFIED WITH: WILSON RN 12/19/22 @ 0015 BY AB Performed at [facility], [place] [address], [place], [place] [address]     Anti-infectives:  Anti-infectives (From admission, onward)    None       Consults: Treatment Team:  Shona Needles, MD   Subjective:    Overnight Issues:  stable Objective:  Vital signs for last 24 hours: Temp:  [97.4 F (36.3 C)-98.3 F (36.8 C)] 98.3 F (36.8 C) (01/25 0800) Pulse Rate:  [67-122] 76 (01/25 0800) Resp:  [10-28] 17 (01/25 0800) BP: (103-139)/(47-110) 137/76 (01/25 0800) SpO2:  [86 %-100 %] 92 % (01/25 0800) Weight:  [77.1 kg] 77.1 kg (01/24 2124)  Hemodynamic parameters for last 24 hours:    Intake/Output from previous day: 01/24 0701 - 01/25 0700 In: 1761.2 [I.V.:761.2; IV Piggyback:1000] Out: 725 [Urine:725]  Intake/Output this shift: Total I/O In: 98.3 [I.V.:98.3] Out: -   Vent settings for last 24 hours:    Physical Exam:  General: alert and no respiratory distress Neuro:  alert and oriented HEENT/Neck: no JVD Resp: clear to auscultation bilaterally CVS: RRR GI: soft, NT Extremities: calves soft Tender L back  Results for orders placed or performed during the hospital encounter of 12/18/22 (from the past 24 hour(s))  CBC with Differential/Platelet     Status: Abnormal   Collection Time: 12/18/22  3:35 PM  Result Value Ref Range   WBC 8.6 4.0 - 10.5 K/uL   RBC 4.28 4.22 - 5.81 MIL/uL   Hemoglobin 12.3 (L) 13.0 - 17.0 g/dL   HCT 37.3 (L) 39.0 - 52.0 %   MCV 87.1 80.0 - 100.0 fL   MCH 28.7 26.0 - 34.0 pg   MCHC 33.0 30.0 - 36.0 g/dL   RDW 13.2 11.5 - 15.5 %   Platelets 183 150 - 400 K/uL   nRBC 0.0 0.0 - 0.2 %   Neutrophils Relative % 66 %   Neutro Abs 5.7 1.7 - 7.7 K/uL   Lymphocytes Relative 18 %   Lymphs Abs 1.6 0.7 - 4.0 K/uL   Monocytes Relative 9 %   Monocytes Absolute 0.8 0.1 - 1.0 K/uL   Eosinophils Relative 5 %   Eosinophils Absolute 0.4 0.0 - 0.5 K/uL   Basophils Relative 1 %   Basophils Absolute 0.1 0.0 - 0.1 K/uL   Immature Granulocytes 1 %   Abs Immature Granulocytes 0.06 0.00 - 0.07 K/uL  Basic metabolic panel     Status: Abnormal   Collection  Time: 12/18/22  3:35 PM  Result Value Ref Range   Sodium 137 135 - 145 mmol/L   Potassium 3.5 3.5 - 5.1 mmol/L   Chloride 104 98 - 111 mmol/L   CO2 24 22 - 32 mmol/L   Glucose, Bld 92 70 - 99 mg/dL   BUN 17 6 - 20 mg/dL   Creatinine, Ser 1.19 0.61 - 1.24 mg/dL   Calcium 8.8 (L) 8.9 - 10.3 mg/dL   GFR, Estimated >60 >60 mL/min   Anion gap 9 5 - 15  Type and screen     Status: None   Collection Time: 12/18/22  3:35 PM  Result Value Ref Range   ABO/RH(D) O POS    Antibody Screen NEG    Sample Expiration      12/21/2022,2359 Performed at [facility], [place] [address] [place], [place] [address]   Ethanol     Status: None   Collection Time: 12/18/22  3:35 PM  Result Value Ref Range   Alcohol, Ethyl (B) <10 <10 mg/dL  MRSA Next Gen by PCR, Nasal     Status: Abnormal    Collection Time: 12/18/22  9:27 PM   Specimen: Nasal Mucosa; Nasal Swab  Result Value Ref Range   MRSA by PCR Next Gen DETECTED (A) NOT DETECTED  ABO/Rh     Status: None   Collection Time: 12/18/22  9:46 PM  Result Value Ref Range   ABO/RH(D)      O POS Performed at [facility] [address] [place], [place] [address]   HIV Antibody (routine testing w rflx)     Status: None   Collection Time: 12/18/22  9:46 PM  Result Value Ref Range   HIV Screen 4th Generation wRfx Non Reactive Non Reactive  CBC     Status: Abnormal   Collection Time: 12/18/22  9:46 PM  Result Value Ref Range   WBC 9.6 4.0 - 10.5 K/uL   RBC 3.91 (L) 4.22 - 5.81 MIL/uL   Hemoglobin 11.5 (L) 13.0 - 17.0 g/dL   HCT 33.2 (L) 39.0 - 52.0 %   MCV 84.9 80.0 - 100.0 fL   MCH 29.4 26.0 - 34.0 pg   MCHC 34.6 30.0 - 36.0 g/dL   RDW 13.4 11.5 - 15.5 %   Platelets 186 150 - 400 K/uL   nRBC 0.0 0.0 - 0.2 %  CBC     Status: Abnormal   Collection Time: 12/19/22  4:47 AM  Result Value Ref Range   WBC 10.3 4.0 - 10.5 K/uL   RBC 3.69 (L) 4.22 - 5.81 MIL/uL   Hemoglobin 10.8 (L) 13.0 - 17.0 g/dL   HCT 31.0 (L) 39.0 - 52.0 %   MCV 84.0 80.0 - 100.0 fL   MCH 29.3 26.0 - 34.0 pg   MCHC 34.8 30.0 - 36.0 g/dL   RDW 13.2 11.5 - 15.5 %   Platelets 173 150 - 400 K/uL   nRBC 0.0 0.0 - 0.2 %  Comprehensive metabolic panel     Status: Abnormal   Collection Time: 12/19/22  4:47 AM  Result Value Ref Range   Sodium 134 (L) 135 - 145 mmol/L   Potassium 4.1 3.5 - 5.1 mmol/L   Chloride 102 98 - 111 mmol/L   CO2 25 22 - 32 mmol/L   Glucose, Bld 132 (H) 70 - 99 mg/dL   BUN 12 6 - 20 mg/dL   Creatinine, Ser 1.06 0.61 - 1.24 mg/dL  Calcium 8.3 (L) 8.9 - 10.3 mg/dL   Total Protein 5.7 (L) 6.5 - 8.1 g/dL   Albumin 3.2 (L) 3.5 - 5.0 g/dL   AST 29 15 - 41 U/L   ALT 18 0 - 44 U/L   Alkaline Phosphatase 44 38 - 126 U/L   Total Bilirubin 0.6 0.3 - 1.2 mg/dL   GFR, Estimated >89 >38 mL/min   Anion gap 7 5 - 15  Protime-INR      Status: None   Collection Time: 12/19/22  4:47 AM  Result Value Ref Range   Prothrombin Time 13.7 11.4 - 15.2 seconds   INR 1.1 0.8 - 1.2    Assessment & Plan: Present on Admission:  Fall    LOS: 1 day   Additional comments:I reviewed the patient's new clinical lab test results. And CT Fall off scaffold 67ft L rib FX 2-9, some segmental - pulm toilet and multimodal pain control, CXR in AM Grade 3 spleen lac with active extrav - S/P angioembolization, follow Hb, bedrest today L sacrum, iliac and pubic FX with hematoma - OR today with Dr. Carola Frost FEN - IVF, NPO for OR VTE - no LMWH yet Dispo - ICU. OR   Critical Care Total Time*: 39 Minutes  Violeta Gelinas, MD, MPH, FACS Trauma & General Surgery Use [email or web address] to contact on call provider  12/19/2022  *Care during the described time interval was provided by me. I have reviewed this patient's available data, including medical history, events of note, physical examination and test results as part of my evaluation.

## 2022-12-19 NOTE — Anesthesia Preprocedure Evaluation (Addendum)
Anesthesia Evaluation  Patient identified by MRN, date of birth, ID band Patient awake    Reviewed: Allergy & Precautions, NPO status , Patient's Chart, lab work & pertinent test results  Airway Mallampati: II  TM Distance: >3 FB Neck ROM: Full    Dental  (+) Dental Advisory Given, Poor Dentition   Pulmonary Current Smoker and Patient abstained from smoking.   Pulmonary exam normal breath sounds clear to auscultation       Cardiovascular negative cardio ROS Normal cardiovascular exam Rhythm:Regular Rate:Normal     Neuro/Psych negative neurological ROS  negative psych ROS   GI/Hepatic negative GI ROS, Neg liver ROS,,,  Endo/Other  negative endocrine ROS    Renal/GU negative Renal ROS  negative genitourinary   Musculoskeletal  (+) Arthritis ,    Abdominal   Peds  Hematology negative hematology ROS (+)   Anesthesia Other Findings Fall off scaffold 62ft -L rib FX 2-9, some segmental - Grade 3 spleen lac with active extrav - S/P angio embolization - L sacrum, iliac and pubic FX with hematoma    Reproductive/Obstetrics                             Anesthesia Physical Anesthesia Plan  ASA: 2  Anesthesia Plan: General   Post-op Pain Management: Tylenol PO (pre-op)*   Induction: Intravenous  PONV Risk Score and Plan: 1 and Midazolam, Dexamethasone and Ondansetron  Airway Management Planned: Oral ETT  Additional Equipment:   Intra-op Plan:   Post-operative Plan: Extubation in OR  Informed Consent: I have reviewed the patients History and Physical, chart, labs and discussed the procedure including the risks, benefits and alternatives for the proposed anesthesia with the patient or authorized representative who has indicated his/her understanding and acceptance.     Dental advisory given  Plan Discussed with: CRNA  Anesthesia Plan Comments:        Anesthesia Quick  Evaluation

## 2022-12-19 NOTE — Anesthesia Procedure Notes (Signed)
Procedure Name: Intubation Date/Time: 12/19/2022 2:46 PM  Performed by: Mosetta Pigeon, CRNAPre-anesthesia Checklist: Patient identified, Emergency Drugs available, Suction available and Patient being monitored Patient Re-evaluated:Patient Re-evaluated prior to induction Oxygen Delivery Method: Circle System Utilized Preoxygenation: Pre-oxygenation with 100% oxygen Induction Type: IV induction Ventilation: Mask ventilation without difficulty Laryngoscope Size: 4 and Mac Grade View: Grade I Tube type: Oral Tube size: 7.5 mm Number of attempts: 1 Airway Equipment and Method: Stylet Placement Confirmation: positive ETCO2, ETT inserted through vocal cords under direct vision and breath sounds checked- equal and bilateral Secured at: 22 cm Tube secured with: Tape Dental Injury: Teeth and Oropharynx as per pre-operative assessment

## 2022-12-19 NOTE — Op Note (Signed)
12/19/2022  4:34 PM  PATIENT:  Brent Pittman  [birth year] male   MEDICAL RECORD NUMBER: [id number]  PRE-OPERATIVE DIAGNOSIS:  UNSTABLE PELVIC RING FRACTURE  POST-OPERATIVE DIAGNOSIS:  UNSTABLE PELVIC RING FRACTURE  PROCEDURE:  Procedure(s): PERCUTANEOUS SACRO-ILIAC SCREW FIXATION, LEFT AND RIGHT (BILATERAL), OF THE POSTERIOR PELVIC RING  SURGEON:  Surgeon(s) and Role:    Altamese [place], MD - Primary  PHYSICIAN ASSISTANT: Ainsley Spinner, PA-C  ANESTHESIA:   general  EBL:  50 mL   BLOOD ADMINISTERED:none  DRAINS: none   LOCAL MEDICATIONS USED:  NONE  SPECIMEN:  No Specimen  DISPOSITION OF SPECIMEN:  N/A  COUNTS:  YES  TOURNIQUET:  * No tourniquets in log *  DICTATION: .Note written in EPIC  PLAN OF CARE: Admit to inpatient   PATIENT DISPOSITION:  PACU - hemodynamically stable.   Delay start of Pharmacological VTE agent (>24hrs) due to surgical blood loss or risk of bleeding: no  BRIEF SUMMARY AND INDICATIONS FOR PROCEDURE:  Patient sustained an unstable pelvic ring injury among other injuries including multiple rib fractures and a splenic laceration with active extravasation for which he has received embolization. Because of the nature and magnitude of these injuries, Dr. Lyla Glassing deemed further management outside his scope of practice and asserted these injuries would be best managed by fellowship trained orthopedic traumatologist.  Consequently, I was consulted to assume management.  We discussed with the risks and benefits of the surgery with the patient including nonunion,, malunion, arthritis, loss of fixation, sexual dysfunction, pain, nerve injury, vessel injury, DVT, PE, and others.  We also specifically discussed urologic injury and footdrop. After full discussion, consent obtained and decision made to proceed.   BRIEF SUMMARY OF PROCEDURE:  The patient was taken to the operating room, where general anesthesia was induced.  The abdomen and flank were prepped and  draped in the usual sterile fashion. Attention was turned first to the left SI joint.  A true lateral view of S1 vertebral body was used to obtain the correct starting point and trajectory. The guide pin was advanced through the iliac bone, across the left SI joint and into the S1 vetebral body. Before advancing the pin we checked its position on the inlet to make sure that it was posterior to the ala and anterior to the canal. We checked the outlet to make sure that it was superior to the S1 foramina and between the vertebral discs.  The guide pin was advanced. We then turned our attention to the right side of the posterior pelvis and further advanced the wire across the right ala, the SI joint, and to the outer cortex of the right ilium.  On this side we also checked  its position on the inlet to make sure that it was posterior to the ala and anterior to the canal, and the outlet to confirm superior to the S1 foramina and below the ala. The pin was measured for length, overdrilled, and then the screw with washer inserted. During both pin and screw placement the pelvis was manipulated to assist reduction.  Final images showed appropriate placement and length across both sides of the pelvis. An additional S2 screw was placed in similar manner, using the lateral for starting pint and trajectory. The pin was driven through the ilium, across left SI joint into the S2 vertebral body, and then driven across the opposite sacral body, right SI joint, and ilium. We again used inlet and outlet views to carefully assume position in addition to  final lateral view, as well. The screw with washer was then inserted after measuring and drilling. Excellent purchase was obtained and the screw was again carefully checked for length and trajectory using inlet, outlet, and lateral views.  Ainsley Spinner, PA-C, was present to assist.  Wounds were irrigated thoroughly and closed in standard fashion with nylon. Sterile dressings were  applied.    PROGNOSIS:  With regard to mobilization, the patient will be WBAT on both lower extremities.   Patient is at risk for SI joint arthrosis and pain. Patient will remain on the Trauma Service and may restart pharmacologic  DVT prophylaxis when no other contraindications but having just received embolization for splenic laceration that is not expected soon and is totally under the care of the Trauma Service.  Will follow closely.       Astrid Divine. Marcelino Scot, M.D.

## 2022-12-19 NOTE — Progress Notes (Signed)
Referring Physician(s): Dr Barry Dienes, Trauma Service   Supervising Physician: Sandi Mariscal  Patient Status:  [facility] - In-pt  Chief Complaint:  Trauma, splenic injury with pseudoaneurym  s/p pseudoaneurysm of the inferior spleen embolization by Dr. Earleen Newport on 12/18/22  Subjective:  Patient seen in short stay, he is scheduled for pelvic fx surgery today.  Sleeping, does not wake up when shaked, NAD.  Withdraws from pain.   Allergies: Patient has no known allergies.  Medications: Prior to Admission medications   Medication Sig Start Date End Date Taking? Authorizing Provider  buprenorphine-naloxone (SUBOXONE) 8-2 mg SUBL SL tablet Place 1 tablet under the tongue 3 (three) times daily. 11/23/22  Yes [provider]  buPROPion HCl (WELLBUTRIN PO)    Yes [provider]  traZODone (DESYREL) 50 MG tablet Take 50 mg by mouth at bedtime. 07/11/22  Yes [provider]  gabapentin (NEURONTIN) 800 MG tablet Take 800 mg by mouth 2 (two) times daily. Patient not taking: Reported on 12/19/2022    [provider]     Vital Signs: BP 137/76 (BP Location: Right Arm)   Pulse 76   Temp 98.3 F (36.8 C) (Axillary)   Resp 17   Ht 5\' 10"  (1.778 m)   Wt 170 lb (77.1 kg) Comment: bed malfunction, unable to measure accurate weight  SpO2 92%   BMI 24.39 kg/m   Physical Exam Vitals reviewed.  Constitutional:      General: He is not in acute distress.    Appearance: He is not ill-appearing.     Comments: Sleeping   Cardiovascular:     Rate and Rhythm: Normal rate and regular rhythm.  Pulmonary:     Effort: Pulmonary effort is normal.  Abdominal:     General: Abdomen is flat.  Skin:    General: Skin is warm and dry.     Comments: Positive dressing on R CFA puncture site. Site is unremarkable with no erythema, edema, tenderness, bleeding or drainage. Minimal amount of old, dry blood noted on the dressing. Dressing otherwise clean, dry, and intact. DP 2+        Imaging: DG Tibia/Fibula Right  Result Date: 12/18/2022 CLINICAL DATA:  [id number] with right tibial vein. EXAM: RIGHT TIBIA AND FIBULA - 2 VIEW COMPARISON:  None Available. FINDINGS: There is no evidence of fracture or other focal bone lesions. Soft tissues are mildly edematous but otherwise unremarkable. IMPRESSION: Mild soft tissue edema. No acute osseous abnormality. Electronically Signed   By: Telford Nab M.D.   On: 12/18/2022 22:00   CT NO CHARGE  Addendum Date: 12/18/2022   ADDENDUM REPORT: 12/18/2022 21:21 ADDENDUM: ADDENDUM REPORT: 12/18/2022 20:25 Mildly displaced fractures of the left sacral ala, left iliac spine, and left superior and inferior pubic rami are again identified in keeping with a grade 2 lateral compression injury with subtle widening of the left sacroiliac joint. The proximal left femur is intact. No dislocation. Hip joint spaces are preserved. Electronically Signed By: Fidela Salisbury M.D. On: 12/18/2022 20:25 Electronically Signed   By: Fidela Salisbury M.D.   On: 12/18/2022 21:21   Result Date: 12/18/2022 CLINICAL DATA:  7 foot fall off scaffolding. EXAM: CT ADDITIONAL VIEWS AT NO CHARGE CT CHEST, ABDOMEN, AND PELVIS WITH CONTRAST TECHNIQUE: Multidetector CT imaging of the chest, abdomen and pelvis was performed following the standard protocol during bolus administration of intravenous contrast. CONTRAST:  RADIATION DOSE REDUCTION: This exam was performed according to the departmental dose-optimization program which includes automated exposure control,  adjustment of the mA and/or kV according to patient size and/or use of iterative reconstruction technique 48mL OMNIPAQUE IOHEXOL 350 MG/ML SOLN COMPARISON:  Chest x-ray earlier 12/18/2022. CT angiogram chest 01/13/2020 FINDINGS: CT CHEST FINDINGS Cardiovascular: Significant streak artifact along the low neck and upper chest from the patient's extensive fixation hardware in the cervical spine. Heart is nonenlarged. No  pericardial effusion. Pulsation artifact along the ascending aorta but no mediastinal hematoma. Grossly normal caliber thoracic aorta. No dissection or aneurysm formation. Minimal atherosclerotic disease. The central pulmonary arteries are preserved. Mediastinum/Nodes: No specific abnormal lymph node enlargement seen in the axillary region, hilum or mediastinum. No pericardial effusion. Small hiatal hernia. Mild gynecomastia. Lungs/Pleura: Breathing motion identified. There is dependent areas of ground-glass bilaterally particularly along the lower lobes. Atelectasis is favored. There is some peripheral areas of interstitial septal thickening with scarring and fibrotic changes along the right side including towards the middle lobe. No clear pneumothorax. Small apical subpleural blebs identified. Additional areas left lower lobe. Likely small left pleural effusion and likely extrapleural hematoma posteroinferior. Musculoskeletal: Multiple posterior left-sided rib fractures are identified. This includes the second, third, fourth, fifth, sixth, seventh, eighth, ninth ribs. No definite scapular fracture on the left. There several ribs which are fractured in more than 1 location. There are fractures of the lateral margin of the left fourth, fifth, sixth, seventh ribs. Please correlate for any flail chest physiology. CT ABDOMEN PELVIS FINDINGS Hepatobiliary: There are some small low-attenuation lesion seen in the liver consistent with benign cysts. Largest is seen in the dome in segment 4 measuring 15 mm in diameter. No specific imaging follow-up. No space-occupying liver lesion otherwise. Gallbladder is nondilated. Patent portal vein. Motion seen throughout the abdomen. Pancreas: Pancreas is grossly preserved without mass lesion or ductal dilatation. There is some subtle heterogeneous enhancement to the midportion of the spleen with some central areas of intraparenchymal active extravasation. Mild adjacent hematoma  inferiorly along the splenic hilum. The splenic artery and vein are grossly enhancing. Spleen: Adrenal glands are preserved. No enhancing renal mass or collecting system dilatation. Adrenals/Urinary Tract: Preserved contours of the urinary bladder. Stomach/Bowel: Large bowel is of normal course and caliber with scattered stool. Overall moderate stool burden. Normal appendix extends inferior to the cecum in the right lower quadrant. The stomach and small bowel are nondilated. Vascular/Lymphatic: Normal caliber aorta and IVC with scattered atherosclerotic calcifications. No specific abnormal lymph node enlargement identified in the abdomen and pelvis. Reproductive: Prostate is unremarkable. Other: No ascites or free air. Small fat containing umbilical hernia. Musculoskeletal: Multiple pelvic fractures are noted. There is subtle fracture of the left hemi sacral Alla. There also fractures of the posterior left iliac bone near the location of the sacroiliac joint. Left inferior and superior pubic rami fractures are identified. There is some hematoma surrounding the pubic bone fractures including along the extraperitoneal space of the low pelvis. No active extravasation seen in this location. Critical Value/emergent results were called by telephone at the time of interpretation on 12/18/2022 at 5:30 pm to provider Lorre Nick , who verbally acknowledged these results. IMPRESSION: Heterogeneous spleen with a small amount of perisplenic hematoma but areas of intrasubstance active extravasation. Extensive left-sided rib fractures of the second through ninth ribs posteriorly. There are some additional fractures along the lateral margin of the left-sided ribcage involving ribs 4 through 7. Many of these are nondisplaced but please correlate with any clinical evidence of flail chest. There is a small extrapleural hematoma and some trace left-sided pleural fluid  but less than 300 cc estimated. No pneumothorax. Multiple  left-sided hemipelvic fractures involving the sacrum, iliac bone and pubic bones. Small extraperitoneal hematoma along the low anterior left hemipelvis. No clear active extravasation. No additional evidence of free air, free fluid or solid organ injury. Motion. Artifacts. Electronically Signed: By: Fidela Salisbury M.D. On: 12/18/2022 21:18   CT CHEST ABDOMEN PELVIS W CONTRAST  Addendum Date: 12/18/2022   ADDENDUM REPORT: 12/18/2022 20:25 ADDENDUM: Mildly displaced fractures of the left sacral ala, left iliac spine, and left superior and inferior pubic rami are again identified in keeping with a grade 2 lateral compression injury with subtle widening of the left sacroiliac joint. The proximal left femur is intact. No dislocation. Hip joint spaces are preserved. Electronically Signed   By: Fidela Salisbury M.D.   On: 12/18/2022 20:25   Result Date: 12/18/2022 CLINICAL DATA:  7 foot fall off scaffolding. EXAM: CT CHEST, ABDOMEN, AND PELVIS WITH CONTRAST TECHNIQUE: Multidetector CT imaging of the chest, abdomen and pelvis was performed following the standard protocol during bolus administration of intravenous contrast. RADIATION DOSE REDUCTION: This exam was performed according to the departmental dose-optimization program which includes automated exposure control, adjustment of the mA and/or kV according to patient size and/or use of iterative reconstruction technique. CONTRAST:  74mL OMNIPAQUE IOHEXOL 350 MG/ML SOLN COMPARISON:  Chest x-ray earlier 12/18/2022. CT angiogram chest 01/13/2020 FINDINGS: CT CHEST FINDINGS Cardiovascular: Significant streak artifact along the low neck and upper chest from the patient's extensive fixation hardware in the cervical spine. Heart is nonenlarged. No pericardial effusion. Pulsation artifact along the ascending aorta but no mediastinal hematoma. Grossly normal caliber thoracic aorta. No dissection or aneurysm formation. Minimal atherosclerotic disease. The central pulmonary  arteries are preserved. Mediastinum/Nodes: No specific abnormal lymph node enlargement seen in the axillary region, hilum or mediastinum. No pericardial effusion. Small hiatal hernia. Mild gynecomastia. Lungs/Pleura: Breathing motion identified. There is dependent areas of ground-glass bilaterally particularly along the lower lobes. Atelectasis is favored. There is some peripheral areas of interstitial septal thickening with scarring and fibrotic changes along the right side including towards the middle lobe. No clear pneumothorax. Small apical subpleural blebs identified. Additional areas left lower lobe. Likely small left pleural effusion and likely extrapleural hematoma posteroinferior. Musculoskeletal: Multiple posterior left-sided rib fractures are identified. This includes the second, third, fourth, fifth, sixth, seventh, eighth, ninth ribs. No definite scapular fracture on the left. There several ribs which are fractured in more than 1 location. There are fractures of the lateral margin of the left fourth, fifth, sixth, seventh ribs. Please correlate for any flail chest physiology. CT ABDOMEN PELVIS FINDINGS Hepatobiliary: There are some small low-attenuation lesion seen in the liver consistent with benign cysts. Largest is seen in the dome in segment 4 measuring 15 mm in diameter. No specific imaging follow-up. No space-occupying liver lesion otherwise. Gallbladder is nondilated. Patent portal vein. Motion seen throughout the abdomen. Pancreas: Pancreas is grossly preserved without mass lesion or ductal dilatation. There is some subtle heterogeneous enhancement to the midportion of the spleen with some central areas of intraparenchymal active extravasation. Mild adjacent hematoma inferiorly along the splenic hilum. The splenic artery and vein are grossly enhancing. Spleen: Adrenal glands are preserved. No enhancing renal mass or collecting system dilatation. Adrenals/Urinary Tract: Preserved contours of the  urinary bladder. Stomach/Bowel: Large bowel is of normal course and caliber with scattered stool. Overall moderate stool burden. Normal appendix extends inferior to the cecum in the right lower quadrant. The stomach and small  bowel are nondilated. Vascular/Lymphatic: Normal caliber aorta and IVC with scattered atherosclerotic calcifications. No specific abnormal lymph node enlargement identified in the abdomen and pelvis. Reproductive: Prostate is unremarkable. Other: No ascites or free air. Small fat containing umbilical hernia. Musculoskeletal: Multiple pelvic fractures are noted. There is subtle fracture of the left hemi sacral Alla. There also fractures of the posterior left iliac bone near the location of the sacroiliac joint. Left inferior and superior pubic rami fractures are identified. There is some hematoma surrounding the pubic bone fractures including along the extraperitoneal space of the low pelvis. No active extravasation seen in this location. Critical Value/emergent results were called by telephone at the time of interpretation on 12/18/2022 at 5:30 pm to provider Lorre Nick , who verbally acknowledged these results. IMPRESSION: Heterogeneous spleen with a small amount of perisplenic hematoma but areas of intrasubstance active extravasation. Extensive left-sided rib fractures of the second through ninth ribs posteriorly. There are some additional fractures along the lateral margin of the left-sided ribcage involving ribs 4 through 7. Many of these are nondisplaced but please correlate with any clinical evidence of flail chest. There is a small extrapleural hematoma and some trace left-sided pleural fluid but less than 300 cc estimated. No pneumothorax. Multiple left-sided hemipelvic fractures involving the sacrum, iliac bone and pubic bones. Small extraperitoneal hematoma along the low anterior left hemipelvis. No clear active extravasation. No additional evidence of free air, free fluid or solid  organ injury. Motion.  Artifacts. Electronically Signed: By: Karen Kays M.D. On: 12/18/2022 17:38   CT Head Wo Contrast  Result Date: 12/18/2022 CLINICAL DATA:  Larey Seat. EXAM: CT HEAD WITHOUT CONTRAST CT CERVICAL SPINE WITHOUT CONTRAST TECHNIQUE: Multidetector CT imaging of the head and cervical spine was performed following the standard protocol without intravenous contrast. Multiplanar CT image reconstructions of the cervical spine were also generated. RADIATION DOSE REDUCTION: This exam was performed according to the departmental dose-optimization program which includes automated exposure control, adjustment of the mA and/or kV according to patient size and/or use of iterative reconstruction technique. COMPARISON:  None Available. FINDINGS: CT HEAD FINDINGS Brain: No evidence of acute infarction, hemorrhage, hydrocephalus, extra-axial collection or mass lesion/mass effect. Vascular: Minimal vascular calcifications. No aneurysm or hyperdense vessels. Skull: No skull fracture or bone lesions. Sinuses/Orbits: Paranasal sinus disease most notably involving the left maxillary sinus and scattered ethmoid air cells. Moderate mucoperiosteal thickening in both halves of the sphenoid sinus. The mastoid air cells and middle ear cavities are clear. Other: No scalp lesions or scalp hematoma. CT CERVICAL SPINE FINDINGS Alignment: Normal Skull base and vertebrae: No obvious fracture. Significant artifact related to anterior and interbody fusion hardware. Soft tissues and spinal canal: No prevertebral fluid or swelling. No visible canal hematoma. Disc levels: The spinal canal is fairly generous. No canal stenosis. Upper chest: The lung apices are grossly clear. Other: Non IMPRESSION: 1. No acute intracranial findings or skull fracture. 2. Paranasal sinus disease. 3. Normal alignment of the cervical spine without acute fracture. 4. Significant artifact related to anterior and interbody fusion hardware. Electronically Signed    By: Rudie Meyer M.D.   On: 12/18/2022 17:36   CT Cervical Spine Wo Contrast  Result Date: 12/18/2022 CLINICAL DATA:  Larey Seat. EXAM: CT HEAD WITHOUT CONTRAST CT CERVICAL SPINE WITHOUT CONTRAST TECHNIQUE: Multidetector CT imaging of the head and cervical spine was performed following the standard protocol without intravenous contrast. Multiplanar CT image reconstructions of the cervical spine were also generated. RADIATION DOSE REDUCTION: This exam was performed  according to the departmental dose-optimization program which includes automated exposure control, adjustment of the mA and/or kV according to patient size and/or use of iterative reconstruction technique. COMPARISON:  None Available. FINDINGS: CT HEAD FINDINGS Brain: No evidence of acute infarction, hemorrhage, hydrocephalus, extra-axial collection or mass lesion/mass effect. Vascular: Minimal vascular calcifications. No aneurysm or hyperdense vessels. Skull: No skull fracture or bone lesions. Sinuses/Orbits: Paranasal sinus disease most notably involving the left maxillary sinus and scattered ethmoid air cells. Moderate mucoperiosteal thickening in both halves of the sphenoid sinus. The mastoid air cells and middle ear cavities are clear. Other: No scalp lesions or scalp hematoma. CT CERVICAL SPINE FINDINGS Alignment: Normal Skull base and vertebrae: No obvious fracture. Significant artifact related to anterior and interbody fusion hardware. Soft tissues and spinal canal: No prevertebral fluid or swelling. No visible canal hematoma. Disc levels: The spinal canal is fairly generous. No canal stenosis. Upper chest: The lung apices are grossly clear. Other: Non IMPRESSION: 1. No acute intracranial findings or skull fracture. 2. Paranasal sinus disease. 3. Normal alignment of the cervical spine without acute fracture. 4. Significant artifact related to anterior and interbody fusion hardware. Electronically Signed   By: Rudie MeyerP.  Gallerani M.D.   On: 12/18/2022  17:36   DG Pelvis Portable  Result Date: 12/18/2022 CLINICAL DATA:  Fall EXAM: PORTABLE PELVIS 1-2 VIEWS COMPARISON:  None Available. FINDINGS: Subtle cortical irregularity of the left superior pubic ramus near the pubic root, equivocal for nondisplaced fracture. No abnormality is evident at the inferior pubic ramus. No pelvic diastasis. Bilateral hips appear intact without fracture or dislocation. IMPRESSION: Subtle cortical irregularity of the left superior pubic ramus near the pubic root, equivocal for nondisplaced fracture. Correlate with point tenderness. Electronically Signed   By: Duanne GuessNicholas  Plundo D.O.   On: 12/18/2022 16:08   DG Chest Port 1 View  Result Date: 12/18/2022 CLINICAL DATA:  Fall EXAM: PORTABLE CHEST 1 VIEW COMPARISON:  03/31/2020 FINDINGS: Patient is rotated. The heart size and mediastinal contours are within normal limits. Lungs are clear. No pneumothorax. Acute mildly displaced fracture of the posterolateral left sixth rib. Nondisplaced fractures of the left seventh and eighth ribs. IMPRESSION: Acute fractures of the left sixth through eighth ribs. No pneumothorax. Electronically Signed   By: Duanne GuessNicholas  Plundo D.O.   On: 12/18/2022 16:07    Labs:  CBC: Recent Labs    12/18/22 1535 12/18/22 2146 12/19/22 0447  WBC 8.6 9.6 10.3  HGB 12.3* 11.5* 10.8*  HCT 37.3* 33.2* 31.0*  PLT 183 186 173    COAGS: Recent Labs    12/19/22 0447  INR 1.1    BMP: Recent Labs    12/18/22 1535 12/19/22 0447  NA 137 134*  K 3.5 4.1  CL 104 102  CO2 24 25  GLUCOSE 92 132*  BUN 17 12  CALCIUM 8.8* 8.3*  CREATININE 1.19 1.06  GFRNONAA >60 >60    LIVER FUNCTION TESTS: Recent Labs    12/19/22 0447  BILITOT 0.6  AST 29  ALT 18  ALKPHOS 44  PROT 5.7*  ALBUMIN 3.2*    Assessment and Plan:  57 y.o. male with trauma, splenic injury with pseudoaneurysm, s/p embolization by Dr. Loreta AveWagner on 12/18/22.   Hgb 10.8 (11.5<<12.3)  VSS RF wnl R CFA puncture site stable, no  appreciable pseudoaneurysm.  R DP 2+   Further treatment plan per Trauma team  Appreciate and agree with the plan.  Please call IR for questions and concerns.     Electronically  Signed: Willette Brace, PA-C 12/19/2022, 11:06 AM   I spent a total of 15 Minutes at the the patient's bedside AND on the patient's hospital floor or unit, greater than 50% of which was counseling/coordinating care for splenic pseudoaneurysm embolization follow up.   This chart was dictated using voice recognition software.  Despite best efforts to proofread,  errors can occur which can change the documentation meaning.

## 2022-12-19 NOTE — Consult Note (Signed)
Reason for Consult:Pelvic fxs Referring Physician: Samson Frederic Time called: [id number] Time at bedside:0857    Brent Pittman is an 57 y.o. male.  HPI: Brent Pittman fell about 7ft from a scaffold and landed on his left side. He had severe pain and was brought to the ED for evaluation. Workup showed multiple pelvic fxs in addition to other injuries and orthopedic surgery was consulted. Due to the complexity of the fxs orthopedic trauma evaluation was requested.  Past Medical History:  Diagnosis Date   Arthritis     Past Surgical History:  Procedure Laterality Date   NECK SURGERY      History reviewed. No pertinent family history.  Social History:  reports that he has been smoking cigarettes. He uses smokeless tobacco. He reports current alcohol use. He reports that he does not use drugs.  Allergies: No Known Allergies  Medications: I have reviewed the patient's current medications.  Results for orders placed or performed during the hospital encounter of 12/18/22 (from the past 48 hour(s))  CBC with Differential/Platelet     Status: Abnormal   Collection Time: 12/18/22  3:35 PM  Result Value Ref Range   WBC 8.6 4.0 - 10.5 K/uL   RBC 4.28 4.22 - 5.81 MIL/uL   Hemoglobin 12.3 (L) 13.0 - 17.0 g/dL   HCT 12.7 (L) 51.7 - 00.1 %   MCV 87.1 80.0 - 100.0 fL   MCH 28.7 26.0 - 34.0 pg   MCHC 33.0 30.0 - 36.0 g/dL   RDW 74.9 44.9 - 67.5 %   Platelets 183 150 - 400 K/uL   nRBC 0.0 0.0 - 0.2 %   Neutrophils Relative % 66 %   Neutro Abs 5.7 1.7 - 7.7 K/uL   Lymphocytes Relative 18 %   Lymphs Abs 1.6 0.7 - 4.0 K/uL   Monocytes Relative 9 %   Monocytes Absolute 0.8 0.1 - 1.0 K/uL   Eosinophils Relative 5 %   Eosinophils Absolute 0.4 0.0 - 0.5 K/uL   Basophils Relative 1 %   Basophils Absolute 0.1 0.0 - 0.1 K/uL   Immature Granulocytes 1 %   Abs Immature Granulocytes 0.06 0.00 - 0.07 K/uL    Comment: Performed at [facility], [address], [place], [place] [address]  Basic  metabolic panel     Status: Abnormal   Collection Time: 12/18/22  3:35 PM  Result Value Ref Range   Sodium 137 135 - 145 mmol/L   Potassium 3.5 3.5 - 5.1 mmol/L   Chloride 104 98 - 111 mmol/L   CO2 24 22 - 32 mmol/L   Glucose, Bld 92 70 - 99 mg/dL    Comment: Glucose reference range applies only to samples taken after fasting for at least 8 hours.   BUN 17 6 - 20 mg/dL   Creatinine, Ser 4.66 0.61 - 1.24 mg/dL   Calcium 8.8 (L) 8.9 - 10.3 mg/dL   GFR, Estimated >59 >93 mL/min    Comment: (NOTE) Calculated using the CKD-EPI Creatinine Equation (2021)    Anion gap 9 5 - 15    Comment: Performed at [facility], [address], [place], [place] [address]  Type and screen     Status: None   Collection Time: 12/18/22  3:35 PM  Result Value Ref Range   ABO/RH(D) O POS    Antibody Screen NEG    Sample Expiration      12/21/2022,2359 Performed at [facility], [address], [place],  [place] [address]   Ethanol     Status: None   Collection Time: 12/18/22  3:35 PM  Result Value Ref Range   Alcohol, Ethyl (B) <10 <10 mg/dL    Comment: (NOTE) Lowest detectable limit for serum alcohol is 10 mg/dL.  For medical purposes only. Performed at [facility], [place] [address] [place], [place] [address]   MRSA Next Gen by PCR, Nasal     Status: Abnormal   Collection Time: 12/18/22  9:27 PM   Specimen: Nasal Mucosa; Nasal Swab  Result Value Ref Range   MRSA by PCR Next Gen DETECTED (A) NOT DETECTED    Comment: RESULT CALLED TO, READ BACK BY AND VERIFIED WITH: WILSON RN 12/19/22 @ 0015 BY AB Performed at [facility], [place] [address], [place], [place] [address]   ABO/Rh     Status: None   Collection Time: 12/18/22  9:46 PM  Result Value Ref Range   ABO/RH(D)      O POS Performed at [place] [address] [place], [place] [address]   HIV Antibody (routine testing w rflx)     Status: None   Collection Time: 12/18/22  9:46 PM  Result Value Ref Range    HIV Screen 4th Generation wRfx Non Reactive Non Reactive    Comment: Performed at [facility], [place] [address] [place], [place] [address]  CBC     Status: Abnormal   Collection Time: 12/18/22  9:46 PM  Result Value Ref Range   WBC 9.6 4.0 - 10.5 K/uL   RBC 3.91 (L) 4.22 - 5.81 MIL/uL   Hemoglobin 11.5 (L) 13.0 - 17.0 g/dL   HCT 33.2 (L) 39.0 - 52.0 %   MCV 84.9 80.0 - 100.0 fL   MCH 29.4 26.0 - 34.0 pg   MCHC 34.6 30.0 - 36.0 g/dL   RDW 13.4 11.5 - 15.5 %   Platelets 186 150 - 400 K/uL   nRBC 0.0 0.0 - 0.2 %    Comment: Performed at [facility], [place] [address] [place], [place] [address]  CBC     Status: Abnormal   Collection Time: 12/19/22  4:47 AM  Result Value Ref Range   WBC 10.3 4.0 - 10.5 K/uL   RBC 3.69 (L) 4.22 - 5.81 MIL/uL   Hemoglobin 10.8 (L) 13.0 - 17.0 g/dL   HCT 31.0 (L) 39.0 - 52.0 %   MCV 84.0 80.0 - 100.0 fL   MCH 29.3 26.0 - 34.0 pg   MCHC 34.8 30.0 - 36.0 g/dL   RDW 13.2 11.5 - 15.5 %   Platelets 173 150 - 400 K/uL   nRBC 0.0 0.0 - 0.2 %    Comment: Performed at [facility], [place] [address] [place],  [address]  Comprehensive metabolic panel     Status: Abnormal   Collection Time: 12/19/22  4:47 AM  Result Value Ref Range   Sodium 134 (L) 135 - 145 mmol/L   Potassium 4.1 3.5 - 5.1 mmol/L   Chloride 102 98 - 111 mmol/L   CO2 25 22 - 32 mmol/L   Glucose, Bld 132 (H) 70 - 99 mg/dL    Comment: Glucose reference range applies only to samples taken after fasting for at least 8 hours.   BUN 12 6 - 20 mg/dL   Creatinine, Ser 1.06 0.61 - 1.24 mg/dL   Calcium 8.3 (L) 8.9 - 10.3 mg/dL   Total Protein  5.7 (L) 6.5 - 8.1 g/dL   Albumin 3.2 (L) 3.5 - 5.0 g/dL   AST 29 15 - 41 U/L   ALT 18 0 - 44 U/L   Alkaline Phosphatase 44 38 - 126 U/L   Total Bilirubin 0.6 0.3 - 1.2 mg/dL   GFR, Estimated >60 >60 mL/min    Comment: (NOTE) Calculated using the CKD-EPI Creatinine Equation (2021)    Anion gap 7 5 - 15    Comment: Performed at [place] [address], [place], [place] [address]  Protime-INR     Status: None   Collection Time: 12/19/22  4:47 AM  Result Value Ref Range   Prothrombin Time 13.7 11.4 - 15.2 seconds   INR 1.1 0.8 - 1.2    Comment: (NOTE) INR goal varies based on device and disease states. Performed at [facility], [place] [address], [place], [place] [address]     DG Tibia/Fibula Right  Result Date: 12/18/2022 CLINICAL DATA:  [id number] with right tibial vein. EXAM: RIGHT TIBIA AND FIBULA - 2 VIEW COMPARISON:  None Available. FINDINGS: There is no evidence of fracture or other focal bone lesions. Soft tissues are mildly edematous but otherwise unremarkable. IMPRESSION: Mild soft tissue edema. No acute osseous abnormality. Electronically Signed   By: Telford Nab M.D.   On: 12/18/2022 22:00   CT NO CHARGE  Addendum Date: 12/18/2022   ADDENDUM REPORT: 12/18/2022 21:21 ADDENDUM: ADDENDUM REPORT: 12/18/2022 20:25 Mildly displaced fractures of the left sacral ala, left iliac spine, and left superior and inferior pubic rami are again identified in keeping with a grade 2 lateral compression injury with subtle widening of the left sacroiliac joint. The proximal left femur is intact. No dislocation. Hip joint spaces are preserved. Electronically Signed By: Fidela Salisbury M.D. On: 12/18/2022 20:25 Electronically Signed   By: Fidela Salisbury M.D.   On: 12/18/2022 21:21   Result Date: 12/18/2022 CLINICAL DATA:  7 foot fall off scaffolding. EXAM: CT ADDITIONAL VIEWS AT NO CHARGE CT CHEST, ABDOMEN, AND PELVIS WITH CONTRAST TECHNIQUE: Multidetector CT imaging of the chest, abdomen and pelvis was performed following the standard protocol during bolus administration of intravenous contrast. CONTRAST:  RADIATION DOSE REDUCTION: This exam was performed according to the departmental dose-optimization program which includes automated exposure control, adjustment of the mA and/or kV according to patient size and/or use of  iterative reconstruction technique 6mL OMNIPAQUE IOHEXOL 350 MG/ML SOLN COMPARISON:  Chest x-ray earlier 12/18/2022. CT angiogram chest 01/13/2020 FINDINGS: CT CHEST FINDINGS Cardiovascular: Significant streak artifact along the low neck and upper chest from the patient's extensive fixation hardware in the cervical spine. Heart is nonenlarged. No pericardial effusion. Pulsation artifact along the ascending aorta but no mediastinal hematoma. Grossly normal caliber thoracic aorta. No dissection or aneurysm formation. Minimal atherosclerotic disease. The central pulmonary arteries are preserved. Mediastinum/Nodes: No specific abnormal lymph node enlargement seen in the axillary region, hilum or mediastinum. No pericardial effusion. Small hiatal hernia. Mild gynecomastia. Lungs/Pleura: Breathing motion identified. There is dependent areas of ground-glass bilaterally particularly along the lower lobes. Atelectasis is favored. There is some peripheral areas of interstitial septal thickening with scarring and fibrotic changes along the right side including towards the middle lobe. No clear pneumothorax. Small apical subpleural blebs identified. Additional areas left lower lobe. Likely small left pleural effusion and likely extrapleural hematoma posteroinferior. Musculoskeletal: Multiple posterior left-sided rib fractures are identified. This includes the second, third, fourth, fifth, sixth, seventh, eighth, ninth ribs. No definite scapular  fracture on the left. There several ribs which are fractured in more than 1 location. There are fractures of the lateral margin of the left fourth, fifth, sixth, seventh ribs. Please correlate for any flail chest physiology. CT ABDOMEN PELVIS FINDINGS Hepatobiliary: There are some small low-attenuation lesion seen in the liver consistent with benign cysts. Largest is seen in the dome in segment 4 measuring 15 mm in diameter. No specific imaging follow-up. No space-occupying liver lesion  otherwise. Gallbladder is nondilated. Patent portal vein. Motion seen throughout the abdomen. Pancreas: Pancreas is grossly preserved without mass lesion or ductal dilatation. There is some subtle heterogeneous enhancement to the midportion of the spleen with some central areas of intraparenchymal active extravasation. Mild adjacent hematoma inferiorly along the splenic hilum. The splenic artery and vein are grossly enhancing. Spleen: Adrenal glands are preserved. No enhancing renal mass or collecting system dilatation. Adrenals/Urinary Tract: Preserved contours of the urinary bladder. Stomach/Bowel: Large bowel is of normal course and caliber with scattered stool. Overall moderate stool burden. Normal appendix extends inferior to the cecum in the right lower quadrant. The stomach and small bowel are nondilated. Vascular/Lymphatic: Normal caliber aorta and IVC with scattered atherosclerotic calcifications. No specific abnormal lymph node enlargement identified in the abdomen and pelvis. Reproductive: Prostate is unremarkable. Other: No ascites or free air. Small fat containing umbilical hernia. Musculoskeletal: Multiple pelvic fractures are noted. There is subtle fracture of the left hemi sacral Alla. There also fractures of the posterior left iliac bone near the location of the sacroiliac joint. Left inferior and superior pubic rami fractures are identified. There is some hematoma surrounding the pubic bone fractures including along the extraperitoneal space of the low pelvis. No active extravasation seen in this location. Critical Value/emergent results were called by telephone at the time of interpretation on 12/18/2022 at 5:30 pm to provider Lorre NickANTHONY ALLEN , who verbally acknowledged these results. IMPRESSION: Heterogeneous spleen with a small amount of perisplenic hematoma but areas of intrasubstance active extravasation. Extensive left-sided rib fractures of the second through ninth ribs posteriorly. There are  some additional fractures along the lateral margin of the left-sided ribcage involving ribs 4 through 7. Many of these are nondisplaced but please correlate with any clinical evidence of flail chest. There is a small extrapleural hematoma and some trace left-sided pleural fluid but less than 300 cc estimated. No pneumothorax. Multiple left-sided hemipelvic fractures involving the sacrum, iliac bone and pubic bones. Small extraperitoneal hematoma along the low anterior left hemipelvis. No clear active extravasation. No additional evidence of free air, free fluid or solid organ injury. Motion. Artifacts. Electronically Signed: By: Helyn NumbersAshesh  Parikh M.D. On: 12/18/2022 21:18   CT CHEST ABDOMEN PELVIS W CONTRAST  Addendum Date: 12/18/2022   ADDENDUM REPORT: 12/18/2022 20:25 ADDENDUM: Mildly displaced fractures of the left sacral ala, left iliac spine, and left superior and inferior pubic rami are again identified in keeping with a grade 2 lateral compression injury with subtle widening of the left sacroiliac joint. The proximal left femur is intact. No dislocation. Hip joint spaces are preserved. Electronically Signed   By: Helyn NumbersAshesh  Parikh M.D.   On: 12/18/2022 20:25   Result Date: 12/18/2022 CLINICAL DATA:  7 foot fall off scaffolding. EXAM: CT CHEST, ABDOMEN, AND PELVIS WITH CONTRAST TECHNIQUE: Multidetector CT imaging of the chest, abdomen and pelvis was performed following the standard protocol during bolus administration of intravenous contrast. RADIATION DOSE REDUCTION: This exam was performed according to the departmental dose-optimization program which includes automated exposure control, adjustment  of the mA and/or kV according to patient size and/or use of iterative reconstruction technique. CONTRAST:  44mL OMNIPAQUE IOHEXOL 350 MG/ML SOLN COMPARISON:  Chest x-ray earlier 12/18/2022. CT angiogram chest 01/13/2020 FINDINGS: CT CHEST FINDINGS Cardiovascular: Significant streak artifact along the low neck and  upper chest from the patient's extensive fixation hardware in the cervical spine. Heart is nonenlarged. No pericardial effusion. Pulsation artifact along the ascending aorta but no mediastinal hematoma. Grossly normal caliber thoracic aorta. No dissection or aneurysm formation. Minimal atherosclerotic disease. The central pulmonary arteries are preserved. Mediastinum/Nodes: No specific abnormal lymph node enlargement seen in the axillary region, hilum or mediastinum. No pericardial effusion. Small hiatal hernia. Mild gynecomastia. Lungs/Pleura: Breathing motion identified. There is dependent areas of ground-glass bilaterally particularly along the lower lobes. Atelectasis is favored. There is some peripheral areas of interstitial septal thickening with scarring and fibrotic changes along the right side including towards the middle lobe. No clear pneumothorax. Small apical subpleural blebs identified. Additional areas left lower lobe. Likely small left pleural effusion and likely extrapleural hematoma posteroinferior. Musculoskeletal: Multiple posterior left-sided rib fractures are identified. This includes the second, third, fourth, fifth, sixth, seventh, eighth, ninth ribs. No definite scapular fracture on the left. There several ribs which are fractured in more than 1 location. There are fractures of the lateral margin of the left fourth, fifth, sixth, seventh ribs. Please correlate for any flail chest physiology. CT ABDOMEN PELVIS FINDINGS Hepatobiliary: There are some small low-attenuation lesion seen in the liver consistent with benign cysts. Largest is seen in the dome in segment 4 measuring 15 mm in diameter. No specific imaging follow-up. No space-occupying liver lesion otherwise. Gallbladder is nondilated. Patent portal vein. Motion seen throughout the abdomen. Pancreas: Pancreas is grossly preserved without mass lesion or ductal dilatation. There is some subtle heterogeneous enhancement to the midportion  of the spleen with some central areas of intraparenchymal active extravasation. Mild adjacent hematoma inferiorly along the splenic hilum. The splenic artery and vein are grossly enhancing. Spleen: Adrenal glands are preserved. No enhancing renal mass or collecting system dilatation. Adrenals/Urinary Tract: Preserved contours of the urinary bladder. Stomach/Bowel: Large bowel is of normal course and caliber with scattered stool. Overall moderate stool burden. Normal appendix extends inferior to the cecum in the right lower quadrant. The stomach and small bowel are nondilated. Vascular/Lymphatic: Normal caliber aorta and IVC with scattered atherosclerotic calcifications. No specific abnormal lymph node enlargement identified in the abdomen and pelvis. Reproductive: Prostate is unremarkable. Other: No ascites or free air. Small fat containing umbilical hernia. Musculoskeletal: Multiple pelvic fractures are noted. There is subtle fracture of the left hemi sacral Alla. There also fractures of the posterior left iliac bone near the location of the sacroiliac joint. Left inferior and superior pubic rami fractures are identified. There is some hematoma surrounding the pubic bone fractures including along the extraperitoneal space of the low pelvis. No active extravasation seen in this location. Critical Value/emergent results were called by telephone at the time of interpretation on 12/18/2022 at 5:30 pm to provider Lorre Nick , who verbally acknowledged these results. IMPRESSION: Heterogeneous spleen with a small amount of perisplenic hematoma but areas of intrasubstance active extravasation. Extensive left-sided rib fractures of the second through ninth ribs posteriorly. There are some additional fractures along the lateral margin of the left-sided ribcage involving ribs 4 through 7. Many of these are nondisplaced but please correlate with any clinical evidence of flail chest. There is a small extrapleural hematoma and  some trace left-sided  pleural fluid but less than 300 cc estimated. No pneumothorax. Multiple left-sided hemipelvic fractures involving the sacrum, iliac bone and pubic bones. Small extraperitoneal hematoma along the low anterior left hemipelvis. No clear active extravasation. No additional evidence of free air, free fluid or solid organ injury. Motion.  Artifacts. Electronically Signed: By: Karen Kays M.D. On: 12/18/2022 17:38   CT Head Wo Contrast  Result Date: 12/18/2022 CLINICAL DATA:  Larey Seat. EXAM: CT HEAD WITHOUT CONTRAST CT CERVICAL SPINE WITHOUT CONTRAST TECHNIQUE: Multidetector CT imaging of the head and cervical spine was performed following the standard protocol without intravenous contrast. Multiplanar CT image reconstructions of the cervical spine were also generated. RADIATION DOSE REDUCTION: This exam was performed according to the departmental dose-optimization program which includes automated exposure control, adjustment of the mA and/or kV according to patient size and/or use of iterative reconstruction technique. COMPARISON:  None Available. FINDINGS: CT HEAD FINDINGS Brain: No evidence of acute infarction, hemorrhage, hydrocephalus, extra-axial collection or mass lesion/mass effect. Vascular: Minimal vascular calcifications. No aneurysm or hyperdense vessels. Skull: No skull fracture or bone lesions. Sinuses/Orbits: Paranasal sinus disease most notably involving the left maxillary sinus and scattered ethmoid air cells. Moderate mucoperiosteal thickening in both halves of the sphenoid sinus. The mastoid air cells and middle ear cavities are clear. Other: No scalp lesions or scalp hematoma. CT CERVICAL SPINE FINDINGS Alignment: Normal Skull base and vertebrae: No obvious fracture. Significant artifact related to anterior and interbody fusion hardware. Soft tissues and spinal canal: No prevertebral fluid or swelling. No visible canal hematoma. Disc levels: The spinal canal is fairly generous. No  canal stenosis. Upper chest: The lung apices are grossly clear. Other: Non IMPRESSION: 1. No acute intracranial findings or skull fracture. 2. Paranasal sinus disease. 3. Normal alignment of the cervical spine without acute fracture. 4. Significant artifact related to anterior and interbody fusion hardware. Electronically Signed   By: Rudie Meyer M.D.   On: 12/18/2022 17:36   CT Cervical Spine Wo Contrast  Result Date: 12/18/2022 CLINICAL DATA:  Larey Seat. EXAM: CT HEAD WITHOUT CONTRAST CT CERVICAL SPINE WITHOUT CONTRAST TECHNIQUE: Multidetector CT imaging of the head and cervical spine was performed following the standard protocol without intravenous contrast. Multiplanar CT image reconstructions of the cervical spine were also generated. RADIATION DOSE REDUCTION: This exam was performed according to the departmental dose-optimization program which includes automated exposure control, adjustment of the mA and/or kV according to patient size and/or use of iterative reconstruction technique. COMPARISON:  None Available. FINDINGS: CT HEAD FINDINGS Brain: No evidence of acute infarction, hemorrhage, hydrocephalus, extra-axial collection or mass lesion/mass effect. Vascular: Minimal vascular calcifications. No aneurysm or hyperdense vessels. Skull: No skull fracture or bone lesions. Sinuses/Orbits: Paranasal sinus disease most notably involving the left maxillary sinus and scattered ethmoid air cells. Moderate mucoperiosteal thickening in both halves of the sphenoid sinus. The mastoid air cells and middle ear cavities are clear. Other: No scalp lesions or scalp hematoma. CT CERVICAL SPINE FINDINGS Alignment: Normal Skull base and vertebrae: No obvious fracture. Significant artifact related to anterior and interbody fusion hardware. Soft tissues and spinal canal: No prevertebral fluid or swelling. No visible canal hematoma. Disc levels: The spinal canal is fairly generous. No canal stenosis. Upper chest: The lung apices  are grossly clear. Other: Non IMPRESSION: 1. No acute intracranial findings or skull fracture. 2. Paranasal sinus disease. 3. Normal alignment of the cervical spine without acute fracture. 4. Significant artifact related to anterior and interbody fusion hardware. Electronically Signed   By:  Rudie MeyerP.  Gallerani M.D.   On: 12/18/2022 17:36   DG Pelvis Portable  Result Date: 12/18/2022 CLINICAL DATA:  Fall EXAM: PORTABLE PELVIS 1-2 VIEWS COMPARISON:  None Available. FINDINGS: Subtle cortical irregularity of the left superior pubic ramus near the pubic root, equivocal for nondisplaced fracture. No abnormality is evident at the inferior pubic ramus. No pelvic diastasis. Bilateral hips appear intact without fracture or dislocation. IMPRESSION: Subtle cortical irregularity of the left superior pubic ramus near the pubic root, equivocal for nondisplaced fracture. Correlate with point tenderness. Electronically Signed   By: Duanne GuessNicholas  Plundo D.O.   On: 12/18/2022 16:08   DG Chest Port 1 View  Result Date: 12/18/2022 CLINICAL DATA:  Fall EXAM: PORTABLE CHEST 1 VIEW COMPARISON:  03/31/2020 FINDINGS: Patient is rotated. The heart size and mediastinal contours are within normal limits. Lungs are clear. No pneumothorax. Acute mildly displaced fracture of the posterolateral left sixth rib. Nondisplaced fractures of the left seventh and eighth ribs. IMPRESSION: Acute fractures of the left sixth through eighth ribs. No pneumothorax. Electronically Signed   By: Duanne GuessNicholas  Plundo D.O.   On: 12/18/2022 16:07    Review of Systems  HENT:  Negative for ear discharge, ear pain, hearing loss and tinnitus.   Eyes:  Negative for photophobia and pain.  Respiratory:  Negative for cough and shortness of breath.   Cardiovascular:  Positive for chest pain.  Gastrointestinal:  Negative for abdominal pain, nausea and vomiting.  Genitourinary:  Negative for dysuria, flank pain, frequency and urgency.  Musculoskeletal:  Negative for back  pain, myalgias and neck pain.  Neurological:  Negative for dizziness and headaches.  Hematological:  Does not bruise/bleed easily.  Psychiatric/Behavioral:  The patient is not nervous/anxious.    Blood pressure 137/76, pulse 76, temperature 98.3 F (36.8 C), temperature source Axillary, resp. rate 17, height 5\' 10"  (1.778 m), weight 77.1 kg, SpO2 92 %. Physical Exam Constitutional:      General: He is not in acute distress.    Appearance: He is well-developed. He is not diaphoretic.  HENT:     Head: Normocephalic and atraumatic.  Eyes:     General: No scleral icterus.       Right eye: No discharge.        Left eye: No discharge.     Conjunctiva/sclera: Conjunctivae normal.  Cardiovascular:     Rate and Rhythm: Normal rate and regular rhythm.  Pulmonary:     Effort: Pulmonary effort is normal. No respiratory distress.  Musculoskeletal:     Cervical back: Normal range of motion.     Comments: Pelvis--no traumatic wounds or rash, no ecchymosis, stable to manual stress, mild pain with lateral compression  BLE No traumatic wounds, ecchymosis, or rash  Nontender  No knee or ankle effusion  Knee stable to varus/ valgus and anterior/posterior stress  Sens DPN, SPN, TN intact  Motor EHL, ext, flex, evers 5/5  DP 1+, PT 1+, No significant edema  Skin:    General: Skin is warm and dry.  Neurological:     Mental Status: He is alert.  Psychiatric:        Mood and Affect: Mood normal.        Behavior: Behavior normal.     Assessment/Plan: Pelvic fxs -- Plan SI screw fixation when cleared by trauma, anticipate later this afternoon with Dr. Carola FrostHandy or tomorrow with Dr. Jena GaussHaddix. Will await guidance from trauma service.    Freeman CaldronMichael J. Mariellen Blaney, PA-C Orthopedic Surgery [phone] 12/19/2022, 9:08 AM

## 2022-12-20 ENCOUNTER — Inpatient Hospital Stay (HOSPITAL_COMMUNITY): Payer: Medicaid Other

## 2022-12-20 LAB — CBC
HCT: 26.6 % — ABNORMAL LOW (ref 39.0–52.0)
Hemoglobin: 9.4 g/dL — ABNORMAL LOW (ref 13.0–17.0)
MCH: 29.7 pg (ref 26.0–34.0)
MCHC: 35.3 g/dL (ref 30.0–36.0)
MCV: 84.2 fL (ref 80.0–100.0)
Platelets: 150 10*3/uL (ref 150–400)
RBC: 3.16 MIL/uL — ABNORMAL LOW (ref 4.22–5.81)
RDW: 13.4 % (ref 11.5–15.5)
WBC: 15.2 10*3/uL — ABNORMAL HIGH (ref 4.0–10.5)
nRBC: 0 % (ref 0.0–0.2)

## 2022-12-20 LAB — BASIC METABOLIC PANEL
Anion gap: 5 (ref 5–15)
BUN: 9 mg/dL (ref 6–20)
CO2: 27 mmol/L (ref 22–32)
Calcium: 8.5 mg/dL — ABNORMAL LOW (ref 8.9–10.3)
Chloride: 103 mmol/L (ref 98–111)
Creatinine, Ser: 0.91 mg/dL (ref 0.61–1.24)
GFR, Estimated: >60 mL/min (ref >60)
Glucose, Bld: 146 mg/dL — ABNORMAL HIGH (ref 70–99)
Potassium: 4.5 mmol/L (ref 3.5–5.1)
Sodium: 135 mmol/L (ref 135–145)

## 2022-12-20 MED ORDER — MORPHINE SULFATE (PF) 2 MG/ML IV SOLN
2.0000 mg | INTRAVENOUS | Status: DC | PRN
  Administered 2022-12-22: 4 mg via INTRAVENOUS
  Filled 2022-12-20: qty 1
  Filled 2022-12-20: qty 2

## 2022-12-20 MED ORDER — BUPRENORPHINE HCL 2 MG SL SUBL
8.0000 mg | SUBLINGUAL_TABLET | Freq: Every day | SUBLINGUAL | Status: DC
  Administered 2022-12-21 – 2022-12-23 (×3): 8 mg via SUBLINGUAL
  Filled 2022-12-20 (×3): qty 4

## 2022-12-20 MED ORDER — LIDOCAINE 5 % EX PTCH
1.0000 | MEDICATED_PATCH | Freq: Every day | CUTANEOUS | Status: DC
  Administered 2022-12-20 – 2022-12-23 (×4): 1 via TRANSDERMAL
  Filled 2022-12-20 (×4): qty 1

## 2022-12-20 MED ORDER — IPRATROPIUM-ALBUTEROL 0.5-2.5 (3) MG/3ML IN SOLN
3.0000 mL | RESPIRATORY_TRACT | Status: DC | PRN
  Administered 2022-12-23 (×2): 3 mL via RESPIRATORY_TRACT
  Filled 2022-12-20 (×2): qty 3

## 2022-12-20 MED ORDER — METHOCARBAMOL 500 MG PO TABS
1000.0000 mg | ORAL_TABLET | Freq: Three times a day (TID) | ORAL | Status: DC
  Administered 2022-12-20 – 2022-12-23 (×8): 1000 mg via ORAL
  Filled 2022-12-20 (×9): qty 2

## 2022-12-20 MED ORDER — OXYCODONE HCL 5 MG PO TABS
5.0000 mg | ORAL_TABLET | ORAL | Status: DC | PRN
  Administered 2022-12-20 – 2022-12-23 (×9): 10 mg via ORAL
  Filled 2022-12-20 (×9): qty 2

## 2022-12-20 MED ORDER — ACETAMINOPHEN 500 MG PO TABS
1000.0000 mg | ORAL_TABLET | Freq: Four times a day (QID) | ORAL | Status: DC
  Administered 2022-12-20 – 2022-12-23 (×10): 1000 mg via ORAL
  Filled 2022-12-20 (×11): qty 2

## 2022-12-20 MED ORDER — ENOXAPARIN SODIUM 30 MG/0.3ML IJ SOSY
30.0000 mg | PREFILLED_SYRINGE | Freq: Two times a day (BID) | INTRAMUSCULAR | Status: DC
  Administered 2022-12-21 – 2022-12-23 (×5): 30 mg via SUBCUTANEOUS
  Filled 2022-12-20 (×5): qty 0.3

## 2022-12-20 MED ORDER — NICOTINE 14 MG/24HR TD PT24
14.0000 mg | MEDICATED_PATCH | Freq: Every day | TRANSDERMAL | Status: DC
  Filled 2022-12-20: qty 1

## 2022-12-20 MED ORDER — IPRATROPIUM-ALBUTEROL 0.5-2.5 (3) MG/3ML IN SOLN
3.0000 mL | RESPIRATORY_TRACT | Status: DC
  Administered 2022-12-20 (×3): 3 mL via RESPIRATORY_TRACT
  Filled 2022-12-20 (×3): qty 3

## 2022-12-20 MED ORDER — IBUPROFEN 600 MG PO TABS
600.0000 mg | ORAL_TABLET | Freq: Four times a day (QID) | ORAL | Status: DC
  Administered 2022-12-20 – 2022-12-23 (×12): 600 mg via ORAL
  Filled 2022-12-20 (×13): qty 1

## 2022-12-20 MED ORDER — MORPHINE SULFATE (PF) 2 MG/ML IV SOLN: 2.0000 mg | INTRAVENOUS | Status: DC | PRN

## 2022-12-20 NOTE — Evaluation (Signed)
Physical Therapy Evaluation Patient Details Name: Brent Pittman MRN: [id number] DOB: 24-Nov-1966 Today's Date: 12/20/2022  History of Present Illness  Pt is a 57 y/o male admitted 1/24 after falling from scaffolding onto his L side from around 7 feet high.  CT/Imaging  showing L rib fx 2-9, spleen Lac, L sacral, iliac and pubic fx, s/p percutaneous sacro-iliac screw fixation, L and R of the posterior pelvic ring.  PMHx: Substance abuse hx (opioid and nicotine).  Clinical Impression  Pt admitted with/for fall and trauma as described above.  Pt's mobility limited by significant pain and needing moderate assist for assist to/from EOB.  Pt currently limited functionally due to the problems listed. ( See problems list.)   Pt will benefit from PT to maximize function and safety in order to get ready for next venue listed below.        Recommendations for follow up therapy are one component of a multi-disciplinary discharge planning process, led by the attending physician.  Recommendations may be updated based on patient status, additional functional criteria and insurance authorization.  Follow Up Recommendations Acute inpatient rehab (3hours/day) (pt likely to try going directly home with HHPT)      Assistance Recommended at Discharge Frequent or constant Supervision/Assistance  Patient can return home with the following  A lot of help with walking and/or transfers;A lot of help with bathing/dressing/bathroom;Assistance with cooking/housework;Assist for transportation;Help with stairs or ramp for entrance;Direct supervision/assist for medications management    Equipment Recommendations Rolling walker (2 wheels);BSC/3in1;Wheelchair (measurements PT);Wheelchair cushion (measurements PT)  Recommendations for Other Services  Rehab consult    Functional Status Assessment Patient has had a recent decline in their functional status and demonstrates the ability to make significant improvements in  function in a reasonable and predictable amount of time.     Precautions / Restrictions Precautions Precautions: Fall Restrictions Weight Bearing Restrictions: Yes RLE Weight Bearing: Weight bearing as tolerated LLE Weight Bearing: Touchdown weight bearing      Mobility  Bed Mobility Overal bed mobility: Needs Assistance Bed Mobility: Supine to Sit, Sit to Supine     Supine to sit: Mod assist Sit to supine: Mod assist   General bed mobility comments: up via L elbow with pain and mod assist, mod +2 for return to supine.  Pt able to scoot  at min guard.    Transfers Overall transfer level: Needs assistance                 General transfer comment: unable to tolerate standing due to pain.    Ambulation/Gait               General Gait Details: unable today  Stairs            Wheelchair Mobility    Modified Rankin (Stroke Patients Only)       Balance Overall balance assessment: Needs assistance Sitting-balance support: Single extremity supported, No upper extremity supported, Feet supported Sitting balance-Leahy Scale: Fair Sitting balance - Comments: but can't tolerate for long                                     Pertinent Vitals/Pain Pain Assessment Pain Assessment: Faces Faces Pain Scale: Hurts worst Pain Location: L ribs and pelvis Pain Descriptors / Indicators: Crushing, Grimacing, Guarding, Sharp Pain Intervention(s): Limited activity within patient's tolerance, Monitored during session, Repositioned, Patient requesting pain meds-RN notified  Home Living Family/patient expects to be discharged to:: Private residence Living Arrangements: Spouse/significant other;Parent Available Help at Discharge: Family;Available 24 hours/day Type of Home: House Home Access: Stairs to enter Entrance Stairs-Rails: None Entrance Stairs-Number of Steps: 1/1 non consecutive   Home Layout: One level Home Equipment: None      Prior  Function Prior Level of Function : Independent/Modified Independent;Working/employed;Driving                     Hand Dominance   Dominant Hand: Left    Extremity/Trunk Assessment   Upper Extremity Assessment Upper Extremity Assessment: Overall WFL for tasks assessed    Lower Extremity Assessment Lower Extremity Assessment: Overall WFL for tasks assessed;Generalized weakness (weakness with guarded to pain.  moves against gravity with pain in pelvis and ribs L.)    Cervical / Trunk Assessment Cervical / Trunk Assessment: Normal  Communication   Communication: No difficulties  Cognition Arousal/Alertness: Awake/alert Behavior During Therapy: WFL for tasks assessed/performed Overall Cognitive Status: Within Functional Limits for tasks assessed                                          General Comments General comments (skin integrity, edema, etc.): sats maintained in low  90's  on O2 during activity.    Exercises     Assessment/Plan    PT Assessment Patient needs continued PT services  PT Problem List Decreased strength;Decreased activity tolerance;Decreased mobility;Decreased knowledge of use of DME;Pain       PT Treatment Interventions DME instruction;Gait training;Functional mobility training;Therapeutic activities;Patient/family education    PT Goals (Current goals can be found in the Care Plan section)  Acute Rehab PT Goals Patient Stated Goal: get home PT Goal Formulation: With patient Time For Goal Achievement: 01/03/23 Potential to Achieve Goals: Good    Frequency Min 4X/week     Co-evaluation               AM-PAC PT "6 Clicks" Mobility  Outcome Measure Help needed turning from your back to your side while in a flat bed without using bedrails?: A Lot Help needed moving from lying on your back to sitting on the side of a flat bed without using bedrails?: A Lot Help needed moving to and from a bed to a chair (including a  wheelchair)?: A Lot Help needed standing up from a chair using your arms (e.g., wheelchair or bedside chair)?: A Lot Help needed to walk in hospital room?: Total Help needed climbing 3-5 steps with a railing? : Total 6 Click Score: 10    End of Session Equipment Utilized During Treatment: Oxygen Activity Tolerance: Patient limited by pain Patient left: in bed;with call bell/phone within reach;with bed alarm set;with family/visitor present Nurse Communication: Mobility status PT Visit Diagnosis: Other abnormalities of gait and mobility (R26.89);Pain Pain - Right/Left: Left Pain - part of body:  (pelvic and L ribs)    Time: 2025-4270 PT Time Calculation (min) (ACUTE ONLY): 25 min   Charges:   PT Evaluation $PT Eval Moderate Complexity: 1 Mod PT Treatments $Therapeutic Activity: 8-22 mins        12/20/2022  Ginger Carne., PT Acute Rehabilitation Services [phone]  (office)  Brent Pittman 12/20/2022, 3:02 PM

## 2022-12-20 NOTE — Progress Notes (Signed)
Inpatient Rehab Admissions Coordinator Note:   Per PT recommendations patient was screened for CIR candidacy by Michel Santee, PT. At this time, pt appears to be a potential candidate for CIR. I will place an order for rehab consult for full assessment, per our protocol.  Please contact me any with questions.   Shann Medal, PT, DPT [phone] 12/20/22 3:39 PM

## 2022-12-20 NOTE — Progress Notes (Signed)
Trauma/Critical Care Follow Up Note  Subjective:    Overnight Issues:   Objective:  Vital signs for last 24 hours: Temp:  [97.3 F (36.3 C)-98.9 F (37.2 C)] 98.2 F (36.8 C) (01/26 0800) Pulse Rate:  [64-111] 75 (01/26 0800) Resp:  [9-27] 16 (01/26 0800) BP: (98-143)/(53-89) 98/53 (01/26 0800) SpO2:  [84 %-99 %] 95 % (01/26 0800) Weight:  [77.1 kg] 77.1 kg (01/25 1227)  Hemodynamic parameters for last 24 hours:    Intake/Output from previous day: 01/25 0701 - 01/26 0700 In: 3360.6 [I.V.:2695.6; IV Piggyback:664.9] Out: 4300 [Urine:4300]  Intake/Output this shift: Total I/O In: 235 [I.V.:200; IV Piggyback:35] Out: 650 [Urine:650]  Vent settings for last 24 hours:    Physical Exam:  Gen: comfortable, no distress Neuro: non-focal exam HEENT: PERRL Neck: supple CV: RRR Pulm: unlabored breathing Abd: soft, NT GU: clear yellow urine Extr: wwp, no edema   Results for orders placed or performed during the hospital encounter of 12/18/22 (from the past 24 hour(s))  CBC     Status: Abnormal   Collection Time: 12/19/22  5:54 PM  Result Value Ref Range   WBC 6.2 4.0 - 10.5 K/uL   RBC 1.85 (L) 4.22 - 5.81 MIL/uL   Hemoglobin 5.5 (LL) 13.0 - 17.0 g/dL   HCT 19.9 (L) 39.0 - 52.0 %   MCV 107.6 (H) 80.0 - 100.0 fL   MCH 29.7 26.0 - 34.0 pg   MCHC 27.6 (L) 30.0 - 36.0 g/dL   RDW 14.0 11.5 - 15.5 %   Platelets 87 (L) 150 - 400 K/uL   nRBC 0.0 0.0 - 0.2 %  CBC     Status: Abnormal   Collection Time: 12/19/22  6:43 PM  Result Value Ref Range   WBC 11.0 (H) 4.0 - 10.5 K/uL   RBC 3.19 (L) 4.22 - 5.81 MIL/uL   Hemoglobin 9.2 (L) 13.0 - 17.0 g/dL   HCT 27.5 (L) 39.0 - 52.0 %   MCV 86.2 80.0 - 100.0 fL   MCH 28.8 26.0 - 34.0 pg   MCHC 33.5 30.0 - 36.0 g/dL   RDW 13.2 11.5 - 15.5 %   Platelets 151 150 - 400 K/uL   nRBC 0.0 0.0 - 0.2 %  CBC     Status: Abnormal   Collection Time: 12/20/22  5:19 AM  Result Value Ref Range   WBC 15.2 (H) 4.0 - 10.5 K/uL   RBC 3.16  (L) 4.22 - 5.81 MIL/uL   Hemoglobin 9.4 (L) 13.0 - 17.0 g/dL   HCT 26.6 (L) 39.0 - 52.0 %   MCV 84.2 80.0 - 100.0 fL   MCH 29.7 26.0 - 34.0 pg   MCHC 35.3 30.0 - 36.0 g/dL   RDW 13.4 11.5 - 15.5 %   Platelets 150 150 - 400 K/uL   nRBC 0.0 0.0 - 0.2 %  Basic metabolic panel     Status: Abnormal   Collection Time: 12/20/22  5:19 AM  Result Value Ref Range   Sodium 135 135 - 145 mmol/L   Potassium 4.5 3.5 - 5.1 mmol/L   Chloride 103 98 - 111 mmol/L   CO2 27 22 - 32 mmol/L   Glucose, Bld 146 (H) 70 - 99 mg/dL   BUN 9 6 - 20 mg/dL   Creatinine, Ser 0.91 0.61 - 1.24 mg/dL   Calcium 8.5 (L) 8.9 - 10.3 mg/dL   GFR, Estimated >60 >60 mL/min   Anion gap 5 5 - 15  Assessment & Plan: The plan of care was discussed with the bedside nurse for the day, who is in agreement with this plan and no additional concerns were raised.   Present on Admission:  Fall    LOS: 2 days   Additional comments:I reviewed the patient's new clinical lab test results.   and I reviewed the patients new imaging test results.    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 for B SI screw FEN - regular diet VTE - LMWH to start 1/27 Dispo - TTF, PT/OT, possible home 1/27 vs 1/28 based on therapy sessions, but motivated to go home 1/27 and has good support   [name], MD Trauma & General Surgery Please use [email or web address] to contact on call provider  12/20/2022  *Care during the described time interval was provided by me. I have reviewed this patient's available data, including medical history, events of  note, physical examination and test results as part of my evaluation.

## 2022-12-20 NOTE — Discharge Instructions (Signed)
Orthopaedic Trauma Service Discharge Instructions   General Discharge Instructions  Orthopaedic Injuries:  Unstable pelvic ring injury treated with sacroiliac screw fixation  WEIGHT BEARING STATUS: Touchdown weightbearing left leg and weight-bear as tolerated right leg.  Use crutches or walker to mobilize.  He will be touchdown weightbearing for about 8 weeks and then we will progress thereafter  RANGE OF MOTION/ACTIVITY: Unrestricted range of motion of hips knees and ankles bilaterally.  Slowly increase activity level  Wound Care: Daily wound care as needed starting when you get home.  Okay to leave wounds open to the air if there is no drainage.  Clean with soap and water only.   Diet: as you were eating previously.  Can use over the counter stool softeners and bowel preparations, such as Miralax, to help with bowel movements.  Narcotics can be constipating.  Be sure to drink plenty of fluids  PAIN MEDICATION USE AND EXPECTATIONS  You have likely been given narcotic medications to help control your pain.  After a traumatic event that results in an fracture (broken bone) with or without surgery, it is ok to use narcotic pain medications to help control one's pain.  We understand that everyone responds to pain differently and each individual patient will be evaluated on a regular basis for the continued need for narcotic medications. Ideally, narcotic medication use should last no more than 6-8 weeks (coinciding with fracture healing).   As a patient it is your responsibility as well to monitor narcotic medication use and report the amount and frequency you use these medications when you come to your office visit.   We would also advise that if you are using narcotic medications, you should take a dose prior to therapy to maximize you participation.  IF YOU ARE ON NARCOTIC MEDICATIONS IT IS NOT PERMISSIBLE TO OPERATE A MOTOR VEHICLE (MOTORCYCLE/CAR/TRUCK/MOPED) OR HEAVY MACHINERY DO NOT  MIX NARCOTICS WITH OTHER CNS (CENTRAL NERVOUS SYSTEM) DEPRESSANTS SUCH AS ALCOHOL   POST-OPERATIVE OPIOID TAPER INSTRUCTIONS: It is important to wean off of your opioid medication as soon as possible. If you do not need pain medication after your surgery it is ok to stop day one. Opioids include: Codeine, Hydrocodone(Norco, Vicodin), Oxycodone(Percocet, oxycontin) and hydromorphone amongst others.  Long term and even short term use of opiods can cause: Increased pain response Dependence Constipation Depression Respiratory depression And more.  Withdrawal symptoms can include Flu like symptoms Nausea, vomiting And more Techniques to manage these symptoms Hydrate well Eat regular healthy meals Stay active Use relaxation techniques(deep breathing, meditating, yoga) Do Not substitute Alcohol to help with tapering If you have been on opioids for less than two weeks and do not have pain than it is ok to stop all together.  Plan to wean off of opioids This plan should start within one week post op of your fracture surgery  Maintain the same interval or time between taking each dose and first decrease the dose.  Cut the total daily intake of opioids by one tablet each day Next start to increase the time between doses. The last dose that should be eliminated is the evening dose.    STOP SMOKING OR USING NICOTINE PRODUCTS!!!!  As discussed nicotine severely impairs your body's ability to heal surgical and traumatic wounds but also impairs bone healing.  Wounds and bone heal by forming microscopic blood vessels (angiogenesis) and nicotine is a vasoconstrictor (essentially, shrinks blood vessels).  Therefore, if vasoconstriction occurs to these microscopic blood vessels they essentially disappear  and are unable to deliver necessary nutrients to the healing tissue.  This is one modifiable factor that you can do to dramatically increase your chances of healing your injury.    (This means no  smoking, no nicotine gum, patches, etc)  DO NOT USE NONSTEROIDAL ANTI-INFLAMMATORY DRUGS (NSAID'S)  Using products such as Advil (ibuprofen), Aleve (naproxen), Motrin (ibuprofen) for additional pain control during fracture healing can delay and/or prevent the healing response.  If you would like to take over the counter (OTC) medication, Tylenol (acetaminophen) is ok.  However, some narcotic medications that are given for pain control contain acetaminophen as well. Therefore, you should not exceed more than 4000 mg of tylenol in a day if you do not have liver disease.  Also note that there are may OTC medicines, such as cold medicines and allergy medicines that my contain tylenol as well.  If you have any questions about medications and/or interactions please ask your doctor/PA or your pharmacist.      ICE AND ELEVATE INJURED/OPERATIVE EXTREMITY  Using ice and elevating the injured extremity above your heart can help with swelling and pain control.  Icing in a pulsatile fashion, such as 20 minutes on and 20 minutes off, can be followed.    Do not place ice directly on skin. Make sure there is a barrier between to skin and the ice pack.    Using frozen items such as frozen peas works well as the conform nicely to the are that needs to be iced.  USE AN ACE WRAP OR TED HOSE FOR SWELLING CONTROL  In addition to icing and elevation, Ace wraps or TED hose are used to help limit and resolve swelling.  It is recommended to use Ace wraps or TED hose until you are informed to stop.    When using Ace Wraps start the wrapping distally (farthest away from the body) and wrap proximally (closer to the body)   Example: If you had surgery on your leg or thing and you do not have a splint on, start the ace wrap at the toes and work your way up to the thigh        If you had surgery on your upper extremity and do not have a splint on, start the ace wrap at your fingers and work your way up to the upper arm  IF YOU ARE  IN A SPLINT OR CAST DO NOT REMOVE IT FOR ANY REASON   If your splint gets wet for any reason please contact the office immediately. You may shower in your splint or cast as long as you keep it dry.  This can be done by wrapping in a cast cover or garbage back (or similar)  Do Not stick any thing down your splint or cast such as pencils, money, or hangers to try and scratch yourself with.  If you feel itchy take benadryl as prescribed on the bottle for itching  IF YOU ARE IN A CAM BOOT (BLACK BOOT)  You may remove boot periodically. Perform daily dressing changes as noted below.  Wash the liner of the boot regularly and wear a sock when wearing the boot. It is recommended that you sleep in the boot until told otherwise    Call office for the following: Temperature greater than 101F Persistent nausea and vomiting Severe uncontrolled pain Redness, tenderness, or signs of infection (pain, swelling, redness, odor or green/yellow discharge around the site) Difficulty breathing, headache or visual disturbances Hives Persistent dizziness or  light-headedness Extreme fatigue Any other questions or concerns you may have after discharge  In an emergency, call 911 or go to an Emergency Department at a nearby hospital  HELPFUL INFORMATION  If you had a block, it will wear off between 8-24 hrs postop typically.  This is period when your pain may go from nearly zero to the pain you would have had postop without the block.  This is an abrupt transition but nothing dangerous is happening.  You may take an extra dose of narcotic when this happens.  You should wean off your narcotic medicines as soon as you are able.  Most patients will be off or using minimal narcotics before their first postop appointment.   We suggest you use the pain medication the first night prior to going to bed, in order to ease any pain when the anesthesia wears off. You should avoid taking pain medications on an empty stomach as it  will make you nauseous.  Do not drink alcoholic beverages or take illicit drugs when taking pain medications.  In most states it is against the law to drive while you are in a splint or sling.  And certainly against the law to drive while taking narcotics.  You may return to work/school in the next couple of days when you feel up to it.   Pain medication may make you constipated.  Below are a few solutions to try in this order: Decrease the amount of pain medication if you aren't having pain. Drink lots of decaffeinated fluids. Drink prune juice and/or each dried prunes  If the first 3 don't work start with additional solutions Take Colace - an over-the-counter stool softener Take Senokot - an over-the-counter laxative Take Miralax - a stronger over-the-counter laxative     CALL THE OFFICE WITH ANY QUESTIONS OR CONCERNS: [phone]   VISIT OUR WEBSITE FOR ADDITIONAL INFORMATION: [email or web address]

## 2022-12-20 NOTE — Progress Notes (Signed)
  Inpatient Rehabilitation Admissions Coordinator   Met with patient at bedside for rehab assessment. We discussed goals and expectations of a possible CIR admit. He is hopeful that once his pain control is managed, that he can progress to discharge home with Mom and girlfriend. He discussed that prior to admit he was on suboxone and that current pain management regimen is not working. He is working on this with his MDs. We will follow up on Monday if he is still hospitalized. He was at work when injured, but does not want to go through his workers comp if needed. Please call me with any questions.   Danne Baxter, RN, MSN Rehab Admissions Coordinator [phone]

## 2022-12-20 NOTE — Progress Notes (Signed)
Orthopaedic Trauma Service Progress Note  Patient ID: Brent Pittman MRN: [id number] DOB/AGE: Dec 06, 1965 57 y.o.  Subjective:  Feels much better than preop Denies any numbness or tingling in his lower extremities Reports moderate left rib pain   ROS As above  Objective:   VITALS:   Vitals:   12/20/22 0500 12/20/22 0600 12/20/22 0700 12/20/22 0800  BP: 119/65 120/70 (!) 141/72 (!) 98/53  Pulse: 91 77 80 75  Resp: 10 14 16 16   Temp:    98.2 F (36.8 C)  TempSrc:    Axillary  SpO2: 97% 99% 98% 95%  Weight:      Height:        Estimated body mass index is 24.39 kg/m as calculated from the following:   Height as of this encounter: 5\' 10"  (1.778 m).   Weight as of this encounter: 77.1 kg.   Intake/Output      01/25 0701 01/26 0700 01/26 0701 01/27 0700   I.V. (mL/kg) 2695.6 (35) 200 (2.6)   IV Piggyback 664.9 35   Total Intake(mL/kg) 3360.6 (43.6) 235 (3)   Urine (mL/kg/hr) 4300 (2.3) 650 (3.4)   Total Output 4300 650   Net -939.5 -415          LABS  Results for orders placed or performed during the hospital encounter of 12/18/22 (from the past 24 hour(s))  CBC     Status: Abnormal   Collection Time: 12/19/22  5:54 PM  Result Value Ref Range   WBC 6.2 4.0 - 10.5 K/uL   RBC 1.85 (L) 4.22 - 5.81 MIL/uL   Hemoglobin 5.5 (LL) 13.0 - 17.0 g/dL   HCT 19.9 (L) 39.0 - 52.0 %   MCV 107.6 (H) 80.0 - 100.0 fL   MCH 29.7 26.0 - 34.0 pg   MCHC 27.6 (L) 30.0 - 36.0 g/dL   RDW 14.0 11.5 - 15.5 %   Platelets 87 (L) 150 - 400 K/uL   nRBC 0.0 0.0 - 0.2 %  CBC     Status: Abnormal   Collection Time: 12/19/22  6:43 PM  Result Value Ref Range   WBC 11.0 (H) 4.0 - 10.5 K/uL   RBC 3.19 (L) 4.22 - 5.81 MIL/uL   Hemoglobin 9.2 (L) 13.0 - 17.0 g/dL   HCT 27.5 (L) 39.0 - 52.0 %   MCV 86.2 80.0 - 100.0 fL   MCH 28.8 26.0 - 34.0 pg   MCHC 33.5 30.0 - 36.0 g/dL   RDW 13.2 11.5 - 15.5 %   Platelets 151  150 - 400 K/uL   nRBC 0.0 0.0 - 0.2 %  CBC     Status: Abnormal   Collection Time: 12/20/22  5:19 AM  Result Value Ref Range   WBC 15.2 (H) 4.0 - 10.5 K/uL   RBC 3.16 (L) 4.22 - 5.81 MIL/uL   Hemoglobin 9.4 (L) 13.0 - 17.0 g/dL   HCT 26.6 (L) 39.0 - 52.0 %   MCV 84.2 80.0 - 100.0 fL   MCH 29.7 26.0 - 34.0 pg   MCHC 35.3 30.0 - 36.0 g/dL   RDW 13.4 11.5 - 15.5 %   Platelets 150 150 - 400 K/uL   nRBC 0.0 0.0 - 0.2 %  Basic metabolic panel     Status: Abnormal   Collection Time: 12/20/22  5:19 AM  Result Value Ref Range   Sodium 135 135 - 145 mmol/L   Potassium 4.5 3.5 - 5.1 mmol/L   Chloride 103 98 - 111 mmol/L   CO2 27 22 - 32 mmol/L   Glucose, Bld 146 (H) 70 - 99 mg/dL   BUN 9 6 - 20 mg/dL   Creatinine, Ser 0.91 0.61 - 1.24 mg/dL   Calcium 8.5 (L) 8.9 - 10.3 mg/dL   GFR, Estimated >60 >60 mL/min   Anion gap 5 5 - 15     PHYSICAL EXAM:   Gen: awake, alert, sitting up in bed, calm, pleasant  Pelvis: dressing L flank clean, dry and intact  No pain to R hemipelvis   Ext warm B   + DP pulses B and symmetric   EHL 5 out of 5 bilaterally  Ankle extension 5 out of 5 bilaterally  No pain or crepitus or instability with manipulation of his lower extremities  No appreciable swelling to his lower extremities E Assessment/Plan: 1 Day Post-Op   Principal Problem:   Fall   Anti-infectives (From admission, onward)    Start     Dose/Rate Route Frequency Ordered Stop   12/19/22 2200  ceFAZolin (ANCEF) IVPB 2g/100 mL premix        2 g 200 mL/hr over 30 Minutes Intravenous Every 8 hours 12/19/22 1726 12/20/22 2159   12/19/22 1545  vancomycin (VANCOCIN) IVPB 1000 mg/200 mL premix        1,000 mg 200 mL/hr over 60 Minutes Intravenous  Once 12/19/22 1534 12/19/22 1847   12/19/22 1530  vancomycin (VANCOCIN) 1,000 mg in sodium chloride 0.9 % 250 mL IVPB        1,000 mg 250 mL/hr over 60 Minutes Intravenous  Once 12/19/22 1519 12/19/22 1635   12/19/22 1530  vancomycin (VANCOREADY)  IVPB 1000 mg/200 mL  Status:  Discontinued        1,000 mg 200 mL/hr over 60 Minutes Intravenous To Surgery 12/19/22 1525 12/19/22 1537     .  POD/HD#: 80  57 year old male fall from scaffold with polytrauma including left LC 2 pelvic ring  -Left LC 2 pelvic ring injury s/p SI screws x 2 (transsacral L to R)  Weightbearing Touchdown weightbearing left leg for 6 to 8 weeks Weight-bear as tolerated right leg   ROM/Activity   Unrestricted range of motion left lower extremity and right lower extremity   Wound care   Daily wound care starting on 12/22/2022.  4 x 4 gauze and tape or Mepilex   Leave open to air once there is no drainage.  Clean with soap and water only   Therapy evaluations  Sutures out in about 2 weeks  - Medical issues   Nicotine dependence    Discussed negative effects of nicotine on bone and wound healing.  No nicotine patches while inpatient   Polysubstance abuse on Suboxone  - DVT/PE prophylaxis:  Scds  Pharmacologic's on hold  Would recommend doing Eliquis 2.5 mg twice daily for 4 weeks at discharge - ID:   Perioperative antibiotics  - Metabolic Bone Disease:  Vitamin D levels pending but his clinical bone quality was good  - Impediments to fracture healing:  Nicotine use  Chronic Suboxone - Dispo:  Ortho issues stable   Follow-up with orthopedics in 2 to 3 weeks    Jari Pigg, PA-C [phone] (C) 12/20/2022, 9:30 AM  [facility] [place] [address] [phone] Domingo Sep (F)  After 5pm and on the weekends please log on to Amion, go to orthopaedics and the look under the [facility] Call for the provider(s) on call. You can also call our office at [phone] and then follow the prompts to be connected to the call team.  Patient ID: Brent Pittman, male   DOB: 1966-01-09, 57 y.o.   MRN: [id number]

## 2022-12-20 NOTE — TOC Initial Note (Signed)
Transition of Care [facility]) - Initial/Assessment Note    Patient Details  Name: Brent Pittman MRN: [id number] Date of Birth: [birth year]  [facility]) CM/SW Contact:    Ella Bodo, RN Phone Number: 12/20/2022, 5:02 PM  Clinical Narrative:                 Pt is a 57 y/o male admitted 1/24 after falling from scaffolding onto his L side from around 7 feet high. CT/Imaging showing L rib fx 2-9, spleen Lac, L sacral, iliac and pubic fx, s/p percutaneous sacro-iliac screw fixation, L and R of the posterior pelvic ring. PMHx: Substance abuse hx (opioid and nicotine).  PTA, pt independent and living at home with mom/girlfriend, who are able to provide needed assistance at dc.  Accident happened at work, but patient does not want to go through worker's comp. Will continue to follow; PT recommending CIR vs. Home health.    Expected Discharge Plan: IP Rehab Facility Barriers to Discharge: Continued Medical Work up              Expected Discharge Plan and Services   Discharge Planning Services: CM Consult   Living arrangements for the past 2 months: Single Family Home Expected Discharge Date: 12/21/22                                    Prior Living Arrangements/Services Living arrangements for the past 2 months: Single Family Home Lives with:: Significant Other, Parents Patient language and need for interpreter reviewed:: Yes        Need for Family Participation in Patient Care: Yes (Comment) Care giver support system in place?: Yes (comment)   Criminal Activity/Legal Involvement Pertinent to Current Situation/Hospitalization: No - Comment as needed            Emotional Assessment   Attitude/Demeanor/Rapport: Engaged Affect (typically observed): Accepting Orientation: : Oriented to Self, Oriented to Place, Oriented to  Time, Oriented to Situation      Admission diagnosis:  Multiple trauma [T07.XXXA] Fall [W19.XXXA] Patient Active Problem List    Diagnosis Date Noted   Fall 12/18/2022   PCP:  Patient, No Pcp Per Pharmacy:   [facility] [address] [place] [address] Phone: [phone] Fax: [phone]     Social Determinants of Health (SDOH) Social History: SDOH Screenings   Tobacco Use: High Risk (12/20/2022)   SDOH Interventions:     Readmission Risk Interventions     No data to display         Reinaldo Raddle, RN, BSN  Trauma/Neuro ICU Case Manager [phone]

## 2022-12-21 LAB — CBC
HCT: 23.9 % — ABNORMAL LOW (ref 39.0–52.0)
Hemoglobin: 8.3 g/dL — ABNORMAL LOW (ref 13.0–17.0)
MCH: 29.9 pg (ref 26.0–34.0)
MCHC: 34.7 g/dL (ref 30.0–36.0)
MCV: 86 fL (ref 80.0–100.0)
Platelets: 152 10*3/uL (ref 150–400)
RBC: 2.78 MIL/uL — ABNORMAL LOW (ref 4.22–5.81)
RDW: 13.7 % (ref 11.5–15.5)
WBC: 10.9 10*3/uL — ABNORMAL HIGH (ref 4.0–10.5)
nRBC: 0 % (ref 0.0–0.2)

## 2022-12-21 LAB — BASIC METABOLIC PANEL
Anion gap: 7 (ref 5–15)
BUN: 17 mg/dL (ref 6–20)
CO2: 29 mmol/L (ref 22–32)
Calcium: 8.4 mg/dL — ABNORMAL LOW (ref 8.9–10.3)
Chloride: 104 mmol/L (ref 98–111)
Creatinine, Ser: 1.43 mg/dL — ABNORMAL HIGH (ref 0.61–1.24)
GFR, Estimated: 58 mL/min — ABNORMAL LOW (ref >60)
Glucose, Bld: 122 mg/dL — ABNORMAL HIGH (ref 70–99)
Potassium: 4.1 mmol/L (ref 3.5–5.1)
Sodium: 140 mmol/L (ref 135–145)

## 2022-12-21 MED ORDER — POLYETHYLENE GLYCOL 3350 17 G PO PACK
17.0000 g | PACK | Freq: Every day | ORAL | Status: DC
  Administered 2022-12-21 – 2022-12-23 (×3): 17 g via ORAL
  Filled 2022-12-21 (×3): qty 1

## 2022-12-21 NOTE — TOC Progression Note (Signed)
Transition of Care [facility]) - Progression Note    Patient Details  Name: Brent Pittman MRN: [id number] Date of Birth: 1966/06/10  [facility]) CM/SW Contact  Bartholomew Crews, RN Phone Number: [phone] 12/21/2022, 10:50 AM  Clinical Narrative:     Spoke with patient at the bedside to discuss post acute transition. Patient stated that he felt he was ok to return home rather than go to inpatient rehab. Discussed recommendations for DME, including wheelchair, BSC, shower stool, and walker. Patient is agreeable but if he needs to choose between wheelchair and walker, he chooses walker. Referral to [employer] for delivery of DME to the room. Provider notes indicate potential DC tomorrow 1/28 pending therapy. Patient stated that he should be able to arrange transportation home. TOC following for transition needs.   Expected Discharge Plan: IP Rehab Facility Barriers to Discharge: Continued Medical Work up  Expected Discharge Plan and Services   Discharge Planning Services: CM Consult   Living arrangements for the past 2 months: Single Family Home Expected Discharge Date: 12/21/22                                     Social Determinants of Health (SDOH) Interventions SDOH Screenings   Food Insecurity: No Food Insecurity (12/20/2022)  Housing: Low Risk  (12/20/2022)  Transportation Needs: No Transportation Needs (12/20/2022)  Utilities: Not At Risk (12/20/2022)  Tobacco Use: High Risk (12/20/2022)    Readmission Risk Interventions     No data to display

## 2022-12-21 NOTE — Evaluation (Signed)
Occupational Therapy Evaluation Patient Details Name: Brent Pittman MRN: [id number] DOB: Jan 30, 1966 Today's Date: 12/21/2022   History of Present Illness Pt is a 56 y/o male admitted 1/24 after falling from scaffolding onto his L side from around 7 feet high.  CT/Imaging  showing L rib fx 2-9, spleen Lac, L sacral, iliac and pubic fx, s/p percutaneous sacro-iliac screw fixation, L and R of the posterior pelvic ring.  PMHx: Substance abuse hx (opioid and nicotine).   Clinical Impression   Pt was independent prior to admission. Presents with significant pain and decreased activity tolerance, had not receive pain meds prior to visit, limited tolerance of movement, but per girlfriend pt ambulated with RW to bathroom and showered with her help in sitting last night. Educated in compensatory strategies for LB ADLs adhering to TDWB on L LE, technique for shower transfer at home and use of 3 in 1 over toilet to elevate. Pt's shower at home is small and will not accommodate use of 3 in 1 as a shower seat, recommending shower seat with a back. Educated in benefits of mobilizing, pt is willing when pain is less.      Recommendations for follow up therapy are one component of a multi-disciplinary discharge planning process, led by the attending physician.  Recommendations may be updated based on patient status, additional functional criteria and insurance authorization.   Follow Up Recommendations  Acute inpatient rehab (3hours/day)     Assistance Recommended at Discharge Frequent or constant Supervision/Assistance  Patient can return home with the following A little help with walking and/or transfers;A lot of help with bathing/dressing/bathroom;Assistance with cooking/housework;Assist for transportation;Help with stairs or ramp for entrance    Functional Status Assessment  Patient has had a recent decline in their functional status and demonstrates the ability to make significant improvements in  function in a reasonable and predictable amount of time.  Equipment Recommendations  BSC/3in1;Tub/shower seat    Recommendations for Other Services       Precautions / Restrictions Precautions Precautions: Fall Restrictions Weight Bearing Restrictions: Yes RLE Weight Bearing: Weight bearing as tolerated LLE Weight Bearing: Touchdown weight bearing      Mobility Bed Mobility   Bed Mobility: Sidelying to Sit, Sit to Sidelying   Sidelying to sit: Mod assist     Sit to sidelying: Mod assist      Transfers                          Balance Overall balance assessment: Needs assistance Sitting-balance support: Single extremity supported, No upper extremity supported, Feet supported Sitting balance-Leahy Scale: Fair                                     ADL either performed or assessed with clinical judgement   ADL Overall ADL's : Needs assistance/impaired Eating/Feeding: Bed level   Grooming: Set up;Bed level   Upper Body Bathing: Moderate assistance;Sitting   Lower Body Bathing: Maximal assistance;Sitting/lateral leans   Upper Body Dressing : Minimal assistance;Sitting   Lower Body Dressing: Maximal assistance;Sitting/lateral leans                 General ADL Comments: Pt limited by pain, educated in compensatory strategies for LB bathing and dressing leaning side to side, use of 3 in 1 over toilet and posterior transfer to shower stall at home with RW. Educated in benefits  of mobilizing.     Vision Ability to See in Adequate Light: 0 Adequate       Perception     Praxis      Pertinent Vitals/Pain Pain Assessment Pain Assessment: Faces Faces Pain Scale: Hurts whole lot Pain Location: L ribs and pelvis Pain Descriptors / Indicators: Crushing, Grimacing, Guarding, Sharp Pain Intervention(s): Monitored during session     Hand Dominance Left   Extremity/Trunk Assessment Upper Extremity Assessment Upper Extremity  Assessment: Overall WFL for tasks assessed   Lower Extremity Assessment Lower Extremity Assessment: Defer to PT evaluation   Cervical / Trunk Assessment Cervical / Trunk Assessment: Normal   Communication Communication Communication: No difficulties   Cognition Arousal/Alertness: Awake/alert Behavior During Therapy: WFL for tasks assessed/performed Overall Cognitive Status: Within Functional Limits for tasks assessed                                       General Comments       Exercises     Shoulder Instructions      Home Living Family/patient expects to be discharged to:: Private residence Living Arrangements: Spouse/significant other;Non-relatives/Friends Available Help at Discharge: Family;Available 24 hours/day Type of Home: House Home Access: Stairs to enter [employer] of Steps: 1/1 non consecutive Entrance Stairs-Rails: None Home Layout: One level     Bathroom Shower/Tub: Occupational psychologist: Handicapped height     Home Equipment: Grab bars - toilet;Grab bars - tub/shower          Prior Functioning/Environment Prior Level of Function : Independent/Modified Independent;Working/employed;Driving                        OT Problem List: Pain;Decreased activity tolerance;Impaired balance (sitting and/or standing);Decreased knowledge of use of DME or AE      OT Treatment/Interventions: Self-care/ADL training;DME and/or AE instruction;Therapeutic activities;Patient/family education;Balance training    OT Goals(Current goals can be found in the care plan section) Acute Rehab OT Goals OT Goal Formulation: With patient/family Time For Goal Achievement: 01/04/23 Potential to Achieve Goals: Good ADL Goals Pt Will Perform Lower Body Bathing: with min assist;with caregiver independent in assisting;sit to/from stand;sitting/lateral leans Pt Will Perform Lower Body Dressing: with min assist;sitting/lateral leans;with  caregiver independent in assisting Pt Will Transfer to Toilet: with supervision;ambulating;bedside commode Pt Will Perform Toileting - Clothing Manipulation and hygiene: with modified independence;sitting/lateral leans Pt Will Perform Tub/Shower Transfer: Shower transfer;with supervision;anterior/posterior transfer;shower seat;rolling walker Additional ADL Goal #1: Pt will perform bed mobility modified independently in preparation for ADLs.  OT Frequency: Min 3X/week    Co-evaluation              AM-PAC OT "6 Clicks" Daily Activity     Outcome Measure Help from another person eating meals?: None Help from another person taking care of personal grooming?: A Little Help from another person toileting, which includes using toliet, bedpan, or urinal?: A Lot Help from another person bathing (including washing, rinsing, drying)?: A Lot Help from another person to put on and taking off regular upper body clothing?: A Little Help from another person to put on and taking off regular lower body clothing?: A Lot 6 Click Score: 16   End of Session    Activity Tolerance: Patient limited by pain Patient left: in bed;with call bell/phone within reach;with family/visitor present  OT Visit Diagnosis: Pain;Unsteadiness on feet (R26.81)  Time: 7824-2353 OT Time Calculation (min): 15 min Charges:  OT General Charges $OT Visit: 1 Visit OT Evaluation $OT Eval Moderate Complexity: 1 Mod  Berna Spare, OTR/L Acute Rehabilitation Services Office: [phone]   Evern Bio 12/21/2022, 9:48 AM

## 2022-12-21 NOTE — Progress Notes (Signed)
2 Days Post-Op   Subjective/Chief Complaint: Feels better   Objective: Vital signs in last 24 hours: Temp:  [97.8 F (36.6 C)-99.1 F (37.3 C)] 97.9 F (36.6 C) (01/27 0447) Pulse Rate:  [73-113] 95 (01/27 0447) Resp:  [14-23] 17 (01/27 0447) BP: (100-139)/(58-82) 122/78 (01/27 0447) SpO2:  [91 %-99 %] 98 % (01/27 0447) Last BM Date :  (PTA)  Intake/Output from previous day: 01/26 0701 - 01/27 0700 In: 1015.5 [P.O.:600; I.V.:380.5; IV Piggyback:35] Out: 800 [Urine:800] Intake/Output this shift: No intake/output data recorded.  General appearance: alert and cooperative Resp: clear to auscultation bilaterally Cardio: regular rate and rhythm GI: soft, distended. Good bs  Lab Results:  Recent Labs    12/20/22 0519 12/21/22 0451  WBC 15.2* 10.9*  HGB 9.4* 8.3*  HCT 26.6* 23.9*  PLT 150 152   BMET Recent Labs    12/20/22 0519 12/21/22 0451  NA 135 140  K 4.5 4.1  CL 103 104  CO2 27 29  GLUCOSE 146* 122*  BUN 9 17  CREATININE 0.91 1.43*  CALCIUM 8.5* 8.4*   PT/INR Recent Labs    12/19/22 0447  LABPROT 13.7  INR 1.1   ABG No results for input(s): "PHART", "HCO3" in the last 72 hours.  Invalid input(s): "PCO2", "PO2"  Studies/Results: DG CHEST PORT 1 VIEW  Result Date: 12/20/2022 CLINICAL DATA:  Left rib fracture EXAM: PORTABLE CHEST 1 VIEW COMPARISON:  12/18/2022 FINDINGS: The left lateral rib deformities appear unchanged.  No pneumothorax. Low lung volumes are present, causing crowding of the pulmonary vasculature. New right mid lung and right basilar airspace opacities may reflect developing pneumonia or atelectasis. Postoperative findings in the lower cervical spine. IMPRESSION: 1. New right mid lung and right basilar airspace opacities may reflect developing pneumonia or atelectasis. 2. Low lung volumes are present, causing crowding of the pulmonary vasculature. 3. Left lateral rib deformities appear unchanged. Electronically Signed   By: Van Clines M.D.   On: 12/20/2022 08:17   DG Pelvis Comp Min 3V  Result Date: 12/19/2022 CLINICAL DATA:  Postop EXAM: JUDET PELVIS - 3+ VIEW COMPARISON:  CT 12/18/2022, pelvic radiograph 12/18/2022 FINDINGS: Interval 2 screw fixation across the bilateral SI joints. Left pubic rami fractures. Left sacral and iliac bone fractures are better seen on the CT. The pubic symphysis is intact. IMPRESSION: Interval screw fixation across bilateral SI joints. Multiple left pelvic fractures. Electronically Signed   By: Donavan Foil M.D.   On: 12/19/2022 19:56   DG Si Joints  Result Date: 12/19/2022 CLINICAL DATA:  Surgery, elective EXAM: BILATERAL SACROILIAC JOINTS - 3+ VIEW COMPARISON:  None Available. FINDINGS: Intraoperative images during sacroiliac screw fixation from left to right across the SI joints for a left-sided sacral and iliac bone fracture. Hardware is intact. IMPRESSION: Intraoperative images during sacroiliac screw fixation. Electronically Signed   By: Maurine Simmering M.D.   On: 12/19/2022 16:59   DG C-Arm 1-60 Min-No Report  Result Date: 12/19/2022 Fluoroscopy was utilized by the requesting physician.  No radiographic interpretation.   DG C-Arm 1-60 Min-No Report  Result Date: 12/19/2022 Fluoroscopy was utilized by the requesting physician.  No radiographic interpretation.    Anti-infectives: Anti-infectives (From admission, onward)    Start     Dose/Rate Route Frequency Ordered Stop   12/19/22 2200  ceFAZolin (ANCEF) IVPB 2g/100 mL premix        2 g 200 mL/hr over 30 Minutes Intravenous Every 8 hours 12/19/22 1726 12/20/22 1344   12/19/22 1545  vancomycin (VANCOCIN) IVPB 1000 mg/200 mL premix        1,000 mg 200 mL/hr over 60 Minutes Intravenous  Once 12/19/22 1534 12/19/22 1847   12/19/22 1530  vancomycin (VANCOCIN) 1,000 mg in sodium chloride 0.9 % 250 mL IVPB        1,000 mg 250 mL/hr over 60 Minutes Intravenous  Once 12/19/22 1519 12/19/22 1635   12/19/22 1530  vancomycin  (VANCOREADY) IVPB 1000 mg/200 mL  Status:  Discontinued        1,000 mg 200 mL/hr over 60 Minutes Intravenous To Surgery 12/19/22 1525 12/19/22 1537       Assessment/Plan: s/p Procedure(s): SACROILIAC SCREW FIXATION OF PELVIS, LEFT TO RIGHT (Left) Advance diet. 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 for B SI screw FEN - regular diet VTE - LMWH to start 1/27 Dispo - TTF, PT/OT, possible home 1/28 based on therapy sessions, but motivated to go home and has good support  LOS: 3 days    Autumn Messing III 12/21/2022

## 2022-12-21 NOTE — Progress Notes (Cosign Needed Addendum)
Durable Medical Equipment  (From admission, onward)           Start     Ordered   12/21/22 1044  For home use only DME Walker rolling  Once       Question Answer Comment  Walker: With [place] Wheels   Patient needs a walker to treat with the following condition Decreased functional mobility and endurance      12/21/22 1043   12/21/22 1044  For home use only DME Shower stool  Once        12/21/22 1043   12/21/22 1043  For home use only DME Bedside commode  Once       Comments: Patient with limitations in mobility related to fall from ladder causing multiple fractures, including to ribs and pelvis, and unable to get from room to bathroom at home  Question:  Patient needs a bedside commode to treat with the following condition  Answer:  Decreased functional mobility and endurance   12/21/22 1043                Durable Medical Equipment  (From admission, onward)           Start     Ordered   12/21/22 1048  For home use only DME lightweight manual wheelchair with seat cushion  Once       Comments: Patient suffers from rib/pelvic fractures which impairs their ability to perform daily activities like bathing, dressing, and toileting in the home.  A walker will not resolve  issue with performing activities of daily living. A wheelchair will allow patient to safely perform daily activities. Patient is not able to propel themselves in the home using a standard weight wheelchair due to endurance. Patient can self propel in the lightweight wheelchair. Length of need 6 months . Accessories: elevating leg rests (ELRs), wheel locks, extensions and anti-tippers.   12/21/22 1048   12/21/22 1044  For home use only DME Walker rolling  Once       Question Answer Comment  Walker: With [place]   Patient needs a walker to treat with the following condition Decreased functional mobility and endurance      12/21/22 1043   12/21/22 1044  For home use only DME Shower stool  Once         12/21/22 1043   12/21/22 1043  For home use only DME Bedside commode  Once       Comments: Patient with limitations in mobility related to fall from ladder causing multiple fractures, including to ribs and pelvis, and unable to get from room to bathroom at home  Question:  Patient needs a bedside commode to treat with the following condition  Answer:  Decreased functional mobility and endurance   12/21/22 1043

## 2022-12-21 NOTE — Progress Notes (Signed)
Physical Therapy Treatment Patient Details Name: Brent Pittman MRN: [id number] DOB: [birth year] Today's Date: 12/21/2022   History of Present Illness Pt is a 57 y/o male admitted 1/24 after falling from scaffolding onto his L side from around 7 feet high.  CT/Imaging  showing L rib fx 2-9, spleen Lac, L sacral, iliac and pubic fx, s/p percutaneous sacro-iliac screw fixation, L and R of the posterior pelvic ring.  PMHx: Substance abuse hx (opioid and nicotine).    PT Comments    Pt received in chair in room, family present, pt premedicated and agreeable to therapy session with emphasis on gait, transfer safety and curb step negotiation. Pt needing up to minA for stair ascent and frequent reminders for sequencing with gait/stairs to prevent increased LLE WB. Pt would benefit from additional session to reinforce L TWB precautions. Pt with improved activity/pain tolerance this session after premedication. Ice packs provided for pain/edema relief. Pt continues to benefit from PT services to progress toward functional mobility goals.   Recommendations for follow up therapy are one component of a multi-disciplinary discharge planning process, led by the attending physician.  Recommendations may be updated based on patient status, additional functional criteria and insurance authorization.  Follow Up Recommendations  Acute inpatient rehab (3hours/day) (HHPT if he instead goes home)     Assistance Recommended at Discharge Frequent or constant Supervision/Assistance  Patient can return home with the following A lot of help with walking and/or transfers;A lot of help with bathing/dressing/bathroom;Assistance with cooking/housework;Assist for transportation;Help with stairs or ramp for entrance;Direct supervision/assist for medications management   Equipment Recommendations  Rolling walker (2 wheels);BSC/3in1;Wheelchair (measurements PT);Wheelchair cushion (measurements PT)    Recommendations for Other  Services Rehab consult     Precautions / Restrictions Precautions Precautions: Fall Restrictions Weight Bearing Restrictions: Yes RLE Weight Bearing: Weight bearing as tolerated LLE Weight Bearing: Touchdown weight bearing     Mobility  Bed Mobility               General bed mobility comments: Pt received up in transport wheelchair in his room, reports he is Okay with this doesn't want to sit in recliner; family member sitting in pt's recliner in his room.    Transfers Overall transfer level: Needs assistance Equipment used: Rolling walker (2 wheels) Transfers: Sit to/from Stand Sit to Stand: Min guard           General transfer comment: cues for using BUE more to offload LLE, safe UE placement to stand and to sit.    Ambulation/Gait Ambulation/Gait assistance: Min guard, Min assist Gait Distance (Feet): 100 Feet Assistive device: Rolling walker (2 wheels) Gait Pattern/deviations: Step-to pattern, Antalgic       General Gait Details: cues for pushing more through BUE to comply with TDWB however pt not always performing correctly, needs reminders not to WB through his L heel. Precautions written on his board to reinforce this with pt/staff. Good RW management.   Stairs Stairs: Yes Stairs assistance: Min assist, +2 safety/equipment Stair Management: Step to pattern, Forwards, With walker Number of Stairs: 2 General stair comments: pt ascended single 4" curb step x2 trials, needed reminder each step for proper sequencing and assist to stabilize RW. Pt at times appears to be putting more than 5% weight through LLE, dense cues.     Balance Overall balance assessment: Needs assistance Sitting-balance support: Single extremity supported, No upper extremity supported, Feet supported Sitting balance-Leahy Scale: Fair Sitting balance - Comments: sitting in transport chair in his  room   Standing balance support: Bilateral upper extremity supported Standing  balance-Leahy Scale: Fair Standing balance comment: reliant on RW given LLE TWB precs                            Cognition Arousal/Alertness: Awake/alert Behavior During Therapy: WFL for tasks assessed/performed Overall Cognitive Status: Within Functional Limits for tasks assessed                                 General Comments: Pt reports understanding of precs however at times non-compliant with L TWB and needed reminders during stair sequencing to prevent putting full weight on LLE. Precautions written on board in his room to reinforce.        Exercises      General Comments General comments (skin integrity, edema, etc.): Pt cooperative, family present and instructed on stair sequencing/TWB precs, may need reinforcement one more session if time. Pt LUE IV site appearing red and a bit swollen (no IV plugged in at tme of session), RN notified.      Pertinent Vitals/Pain Pain Assessment Pain Assessment: Faces Faces Pain Scale: Hurts even more Pain Location: L ribs and pelvis Pain Descriptors / Indicators: Crushing, Grimacing, Guarding, Sharp Pain Intervention(s): Monitored during session, Limited activity within patient's tolerance, Premedicated before session, Repositioned, Ice applied           PT Goals (current goals can now be found in the care plan section) Acute Rehab PT Goals PT Goal Formulation: With patient Time For Goal Achievement: 01/03/23 Progress towards PT goals: Progressing toward goals    Frequency    Min 4X/week      PT Plan Current plan remains appropriate       AM-PAC PT "6 Clicks" Mobility   Outcome Measure  Help needed turning from your back to your side while in a flat bed without using bedrails?: A Little Help needed moving from lying on your back to sitting on the side of a flat bed without using bedrails?: A Little Help needed moving to and from a bed to a chair (including a wheelchair)?: A Little Help needed  standing up from a chair using your arms (e.g., wheelchair or bedside chair)?: A Little Help needed to walk in hospital room?: A Little Help needed climbing 3-5 steps with a railing? : A Little 6 Click Score: 18    End of Session Equipment Utilized During Treatment: Gait belt Activity Tolerance: Patient tolerated treatment well;Patient limited by pain Patient left: in chair;with call bell/phone within reach;with family/visitor present Nurse Communication: Mobility status;Weight bearing status;Precautions;Other (comment) (written on board as well; pt IV site appears swollen) PT Visit Diagnosis: Other abnormalities of gait and mobility (R26.89);Pain Pain - Right/Left: Left     Time: 6203-5597 PT Time Calculation (min) (ACUTE ONLY): 21 min  Charges:  $Gait Training: 8-22 mins                     Brent Pittman P., PTA Acute Rehabilitation Services Secure Chat Preferred 9a-5:30pm Office: [place] 12/21/2022, 6:02 PM

## 2022-12-22 ENCOUNTER — Encounter (HOSPITAL_COMMUNITY): Payer: Self-pay | Admitting: Orthopedic Surgery

## 2022-12-22 LAB — CBC
HCT: 23.8 % — ABNORMAL LOW (ref 39.0–52.0)
Hemoglobin: 7.9 g/dL — ABNORMAL LOW (ref 13.0–17.0)
MCH: 29.3 pg (ref 26.0–34.0)
MCHC: 33.2 g/dL (ref 30.0–36.0)
MCV: 88.1 fL (ref 80.0–100.0)
Platelets: 177 10*3/uL (ref 150–400)
RBC: 2.7 MIL/uL — ABNORMAL LOW (ref 4.22–5.81)
RDW: 13.7 % (ref 11.5–15.5)
WBC: 8.3 10*3/uL (ref 4.0–10.5)
nRBC: 0 % (ref 0.0–0.2)

## 2022-12-22 LAB — BASIC METABOLIC PANEL
Anion gap: 7 (ref 5–15)
BUN: 15 mg/dL (ref 6–20)
CO2: 27 mmol/L (ref 22–32)
Calcium: 8.4 mg/dL — ABNORMAL LOW (ref 8.9–10.3)
Chloride: 103 mmol/L (ref 98–111)
Creatinine, Ser: 1.15 mg/dL (ref 0.61–1.24)
GFR, Estimated: >60 mL/min (ref >60)
Glucose, Bld: 92 mg/dL (ref 70–99)
Potassium: 4.3 mmol/L (ref 3.5–5.1)
Sodium: 137 mmol/L (ref 135–145)

## 2022-12-22 NOTE — Plan of Care (Signed)
  Problem: Activity: Goal: Ability to return to baseline activity level will improve Outcome: Progressing   Problem: Cardiovascular: Goal: Ability to achieve and maintain adequate cardiovascular perfusion will improve Outcome: Progressing   Problem: Health Behavior/Discharge Planning: Goal: Ability to safely manage health-related needs after discharge will improve Outcome: Progressing   Problem: Education: Goal: Knowledge of General Education information will improve Description: Including pain rating scale, medication(s)/side effects and non-pharmacologic comfort measures Outcome: Progressing   Problem: Health Behavior/Discharge Planning: Goal: Ability to manage health-related needs will improve Outcome: Progressing   Problem: Clinical Measurements: Goal: Respiratory complications will improve Outcome: Progressing Goal: Cardiovascular complication will be avoided Outcome: Progressing   Problem: Activity: Goal: Risk for activity intolerance will decrease Outcome: Progressing

## 2022-12-22 NOTE — Progress Notes (Signed)
3 Days Post-Op   Subjective/Chief Complaint: Feels better. Had a bm this am   Objective: Vital signs in last 24 hours: Temp:  [97.9 F (36.6 C)-98.9 F (37.2 C)] 97.9 F (36.6 C) (01/28 0758) Pulse Rate:  [79-101] 79 (01/28 0758) Resp:  [16-18] 16 (01/28 0758) BP: (113-129)/(66-72) 126/67 (01/28 0758) SpO2:  [92 %-98 %] 95 % (01/28 0758) Last BM Date : 12/22/22  Intake/Output from previous day: 01/27 0701 - 01/28 0700 In: 480 [P.O.:480] Out: -  Intake/Output this shift: No intake/output data recorded.  General appearance: alert and cooperative Resp: clear to auscultation bilaterally Cardio: regular rate and rhythm GI: soft, nontender  Lab Results:  Recent Labs    12/21/22 0451 12/22/22 0233  WBC 10.9* 8.3  HGB 8.3* 7.9*  HCT 23.9* 23.8*  PLT 152 177   BMET Recent Labs    12/21/22 0451 12/22/22 0233  NA 140 137  K 4.1 4.3  CL 104 103  CO2 29 27  GLUCOSE 122* 92  BUN 17 15  CREATININE 1.43* 1.15  CALCIUM 8.4* 8.4*   PT/INR No results for input(s): "LABPROT", "INR" in the last 72 hours. ABG No results for input(s): "PHART", "HCO3" in the last 72 hours.  Invalid input(s): "PCO2", "PO2"  Studies/Results: No results found.  Anti-infectives: Anti-infectives (From admission, onward)    Start     Dose/Rate Route Frequency Ordered Stop   12/19/22 2200  ceFAZolin (ANCEF) IVPB 2g/100 mL premix        2 g 200 mL/hr over 30 Minutes Intravenous Every 8 hours 12/19/22 1726 12/20/22 1344   12/19/22 1545  vancomycin (VANCOCIN) IVPB 1000 mg/200 mL premix        1,000 mg 200 mL/hr over 60 Minutes Intravenous  Once 12/19/22 1534 12/19/22 1847   12/19/22 1530  vancomycin (VANCOCIN) 1,000 mg in sodium chloride 0.9 % 250 mL IVPB        1,000 mg 250 mL/hr over 60 Minutes Intravenous  Once 12/19/22 1519 12/19/22 1635   12/19/22 1530  vancomycin (VANCOREADY) IVPB 1000 mg/200 mL  Status:  Discontinued        1,000 mg 200 mL/hr over 60 Minutes Intravenous To Surgery  12/19/22 1525 12/19/22 1537       Assessment/Plan: s/p Procedure(s): SACROILIAC SCREW FIXATION OF PELVIS, LEFT TO RIGHT (Left) Advance diet Fall off scaffold 58ft   L rib FX 2-9, with flail segment - pulm toilet and multimodal pain control, CXR with infiltrates, aggressive pulm toilet, add IS/flutter/duonebs Substance abuse history (opioid, nicotine) - on suboxone at baseline, change to subutex, no nicotine patch per ortho recs 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 for B SI screw FEN - regular diet VTE - LMWH to start 1/27 Dispo - TTF, PT/OT recommend rehab. Await placement  LOS: 4 days    Autumn Messing III 12/22/2022

## 2022-12-23 ENCOUNTER — Other Ambulatory Visit (HOSPITAL_COMMUNITY): Payer: Self-pay

## 2022-12-23 MED ORDER — POLYETHYLENE GLYCOL 3350 17 GM/SCOOP PO POWD
17.0000 g | Freq: Every day | ORAL | 0 refills | Status: AC
  Filled 2022-12-23: qty 238, 14d supply, fill #0

## 2022-12-23 MED ORDER — OXYCODONE HCL 5 MG PO TABS
5.0000 mg | ORAL_TABLET | ORAL | 0 refills | Status: AC | PRN
  Filled 2022-12-23: qty 30, 3d supply, fill #0

## 2022-12-23 MED ORDER — ACETAMINOPHEN 500 MG PO TABS
1000.0000 mg | ORAL_TABLET | Freq: Four times a day (QID) | ORAL | 0 refills | Status: AC
  Filled 2022-12-23: qty 30, 4d supply, fill #0

## 2022-12-23 MED ORDER — DM-GUAIFENESIN ER 30-600 MG PO TB12
1.0000 | ORAL_TABLET | Freq: Two times a day (BID) | ORAL | 0 refills | Status: AC
  Filled 2022-12-23: qty 20, 10d supply, fill #0

## 2022-12-23 MED ORDER — DM-GUAIFENESIN ER 30-600 MG PO TB12
1.0000 | ORAL_TABLET | Freq: Two times a day (BID) | ORAL | Status: DC
  Administered 2022-12-23: 1 via ORAL
  Filled 2022-12-23: qty 1

## 2022-12-23 MED ORDER — APIXABAN 2.5 MG PO TABS
2.5000 mg | ORAL_TABLET | Freq: Two times a day (BID) | ORAL | 0 refills | Status: AC
  Filled 2022-12-23: qty 60, 30d supply, fill #0

## 2022-12-23 MED ORDER — DOCUSATE SODIUM 100 MG PO CAPS
100.0000 mg | ORAL_CAPSULE | Freq: Two times a day (BID) | ORAL | 0 refills | Status: AC
  Filled 2022-12-23: qty 10, 5d supply, fill #0

## 2022-12-23 MED ORDER — IBUPROFEN 600 MG PO TABS
600.0000 mg | ORAL_TABLET | Freq: Four times a day (QID) | ORAL | 0 refills | Status: AC | PRN
  Filled 2022-12-23: qty 30, 8d supply, fill #0

## 2022-12-23 MED ORDER — METHOCARBAMOL 500 MG PO TABS
1000.0000 mg | ORAL_TABLET | Freq: Three times a day (TID) | ORAL | 0 refills | Status: AC | PRN
  Filled 2022-12-23: qty 80, 14d supply, fill #0

## 2022-12-23 NOTE — TOC Transition Note (Signed)
Transition of Care [facility]) - CM/SW Discharge Note   Patient Details  Name: KAISEI GILBO MRN: [id number] Date of Birth: [birth year]  [facility]) CM/SW Contact:  Ella Bodo, RN Phone Number: 12/23/2022, 1622  Clinical Narrative:    Patient medically stable for discharge home today with significant other to provide needed assistance.  RW, BSC,and shower seat were delivered to patient's room by [place].  HHPT/OT recommended and ordered by provider; referral to [facility].  Patient appreciative and excited about going home.     Final next level of care: Home w Home Health Services Barriers to Discharge: Barriers Resolved   Patient Goals and CMS Choice CMS Medicare.gov Compare Post Acute Care list provided to:: Patient Choice offered to / list presented to : Patient                      Discharge Plan and Services Additional resources added to the After Visit Summary for     Discharge Planning Services: CM Consult Post Acute Care Choice: Home Health          DME Arranged: Walker rolling, Bedside commode, Shower stool DME Agency: [employer] Date DME Agency Contacted: 12/22/22     HH Arranged: PT, OT HH Agency: [facility] ([place]) Date HH Agency Contacted: 12/23/22 Time [place]: 1205 Representative spoke with at [place]: Lillia [place]  Social Determinants of Health (SDOH) Interventions SDOH Screenings   Food Insecurity: No Food Insecurity (12/20/2022)  Housing: Low Risk  (12/20/2022)  Transportation Needs: No Transportation Needs (12/20/2022)  Utilities: Not At Risk (12/20/2022)  Tobacco Use: High Risk (12/22/2022)     Readmission Risk Interventions     No data to display         Reinaldo Raddle, RN, BSN  Trauma/Neuro ICU Case Manager [phone]

## 2022-12-23 NOTE — Progress Notes (Signed)
Mobility Specialist - Progress Note   12/23/22 0900  Mobility  Activity Ambulated with assistance in room  Level of Assistance Standby assist, set-up cues, supervision of patient - no hands on  Assistive Device Front wheel walker  Distance Ambulated (ft) 40 ft  RLE Weight Bearing WBAT  LLE Weight Bearing TWB  Activity Response Tolerated well  Mobility Referral Yes  $Mobility charge 1 Mobility    Pt received in bed agreeable to mobility. Extra cues needed for compliance w/ LLE TWB status. Left in bed w/ call bell in reach.    [place] Specialist Please contact via SecureChat or Rehab office at [phone]

## 2022-12-23 NOTE — Progress Notes (Signed)
Occupational Therapy Treatment Patient Details Name: Brent Pittman MRN: [id number] DOB: 06-11-1966 Today's Date: 12/23/2022   History of present illness Pt is a 57 y/o male admitted 1/24 after falling from scaffolding onto his L side from around 7 feet high.  CT/Imaging  showing L rib fx 2-9, spleen Lac, L sacral, iliac and pubic fx, s/p percutaneous sacro-iliac screw fixation, L and R of the posterior pelvic ring.  PMHx: Substance abuse hx (opioid and nicotine).   OT comments  Pt reports having ambulated x 3 to bathroom today with RW. Focus of session on educating pt in use of 3 in 1 to elevate toilet, compensatory strategies for LB ADLs using reacher in sitting, recommended long handled bath sponge to wash feet, pt choosing to rely on girlfriend for donning socks. Reinforced technique for transfer to shower seat at home adhering to TDWB. Encouraged continued use of flutter valve and IS. Pt verbalized and/or demonstrated understanding of all information. Updated d/c recommendation to home, pt has progressed well.    Recommendations for follow up therapy are one component of a multi-disciplinary discharge planning process, led by the attending physician.  Recommendations may be updated based on patient status, additional functional criteria and insurance authorization.    Follow Up Recommendations  Home health OT     Assistance Recommended at Discharge Frequent or constant Supervision/Assistance  Patient can return home with the following  A little help with walking and/or transfers;Assistance with cooking/housework;Assist for transportation;Help with stairs or ramp for entrance;A little help with bathing/dressing/bathroom   Equipment Recommendations  BSC/3in1;Tub/shower seat    Recommendations for Other Services      Precautions / Restrictions Precautions Precautions: Fall Restrictions Weight Bearing Restrictions: Yes RLE Weight Bearing: Weight bearing as tolerated LLE Weight  Bearing: Touchdown weight bearing       Mobility Bed Mobility Overal bed mobility: Modified Independent                  Transfers Overall transfer level: Needs assistance Equipment used: Rolling walker (2 wheels) Transfers: Sit to/from Stand Sit to Stand: Supervision           General transfer comment: cues for technique to maintain TDWB     Balance Overall balance assessment: Needs assistance Sitting-balance support: No upper extremity supported Sitting balance-Leahy Scale: Fair     Standing balance support: Bilateral upper extremity supported Standing balance-Leahy Scale: Fair                             ADL either performed or assessed with clinical judgement   ADL Overall ADL's : Needs assistance/impaired     Grooming: Wash/dry hands;Standing;Supervision/safety         Lower Body Bathing Details (indicate cue type and reason): educated in use of long handled bath sponge and reacher to wash and dry LEs Upper Body Dressing : Set up;Sitting     Lower Body Dressing Details (indicate cue type and reason): educated to dress L LE first using reacher and to undress it last, use of reacher to doff socks, pt prefers to rely on girlfriend to help with socks Toilet Transfer: Supervision/safety;Ambulation;Rolling walker (2 wheels)       Tub/ Shower Transfer: Walk-in shower;Ambulation;Shower seat;Rolling walker (2 wheels) Tub/Shower Transfer Details (indicate cue type and reason): report shower seat has been delivered, instructed in shower transfer maintaining TDWB Functional mobility during ADLs: Supervision/safety;Rolling walker (2 wheels)      Extremity/Trunk Assessment  Vision       Perception     Praxis      Cognition Arousal/Alertness: Awake/alert Behavior During Therapy: WFL for tasks assessed/performed Overall Cognitive Status: Within Functional Limits for tasks assessed                                  General Comments: reminders to generalize TDWB        Exercises      Shoulder Instructions       General Comments      Pertinent Vitals/ Pain       Pain Assessment Pain Assessment: Faces Faces Pain Scale: Hurts even more Pain Location: L ribs and pelvis Pain Descriptors / Indicators: Grimacing, Guarding, Sharp Pain Intervention(s): Monitored during session, Premedicated before session, Repositioned  Home Living                                          Prior Functioning/Environment              Frequency  Min 3X/week        Progress Toward Goals  OT Goals(current goals can now be found in the care plan section)  Progress towards OT goals: Progressing toward goals  Acute Rehab OT Goals OT Goal Formulation: With patient/family Time For Goal Achievement: 01/04/23 Potential to Achieve Goals: Good  Plan Discharge plan needs to be updated    Co-evaluation                 AM-PAC OT "6 Clicks" Daily Activity     Outcome Measure   Help from another person eating meals?: None Help from another person taking care of personal grooming?: A Little Help from another person toileting, which includes using toliet, bedpan, or urinal?: A Little Help from another person bathing (including washing, rinsing, drying)?: A Little Help from another person to put on and taking off regular upper body clothing?: None Help from another person to put on and taking off regular lower body clothing?: A Little 6 Click Score: 20    End of Session Equipment Utilized During Treatment: Rolling walker (2 wheels)  OT Visit Diagnosis: Pain;Unsteadiness on feet (R26.81)   Activity Tolerance Patient tolerated treatment well   Patient Left in bed;with call bell/phone within reach;with family/visitor present   Nurse Communication          Time: 7989-2119 OT Time Calculation (min): 22 min  Charges: OT General Charges $OT Visit: 1 Visit OT  Treatments $Self Care/Home Management : 8-22 mins  Cleta Alberts, OTR/L Acute Rehabilitation Services Office: [phone]   Malka So 12/23/2022, 10:31 AM

## 2022-12-23 NOTE — Progress Notes (Signed)
Inpatient Rehab Admissions Coordinator:   Note updated OT recommendations for home health.  Will sign off for CIR at this time.   Shann Medal, PT, DPT Admissions Coordinator [phone] 12/23/22  11:19 AM

## 2022-12-23 NOTE — Plan of Care (Signed)
  Problem: Clinical Measurements: Goal: Will remain free from infection Outcome: Progressing   Problem: Coping: Goal: Level of anxiety will decrease Outcome: Progressing   Problem: Safety: Goal: Ability to remain free from injury will improve Outcome: Progressing   

## 2022-12-23 NOTE — TOC CAGE-AID Note (Signed)
Transition of Care [facility]) - CAGE-AID Screening   Patient Details  Name: DYAMI UMBACH MRN: [id number] Date of Birth: Feb 11, 1966  [facility]) CM/SW Contact:    Ella Bodo, RN Phone Number: 12/23/2022, 12:02 PM   Clinical Narrative: Pt is a 57 y/o male admitted 1/24 after falling from scaffolding onto his L side from around 7 feet high. Patient denies any drug or ETOH use.     CAGE-AID Screening:    Have You Ever Felt You Ought to Cut Down on Your Drinking or Drug Use?: No Have People Annoyed You By Critizing Your Drinking Or Drug Use?: No Have You Felt Bad Or Guilty About Your Drinking Or Drug Use?: No Have You Ever Had a Drink or Used Drugs First Thing In The Morning to Steady Your Nerves or to Get Rid of a Hangover?: No CAGE-AID Score: 0  Substance Abuse Education Offered: No    Reinaldo Raddle, RN, BSN  Trauma/Neuro ICU Case Manager [phone]

## 2022-12-23 NOTE — Progress Notes (Signed)
Brent Pittman to be D/C'd  per MD order.  Discussed with the patient and all questions fully answered.  VSS, Skin clean, dry and intact without evidence of skin break down, no evidence of skin tears noted.  IV catheter discontinued intact. Site without signs and symptoms of complications. Dressing and pressure applied.  An After Visit Summary was printed and given to the patient. Patient received prescription from [place] and equipment delivered.  D/c education completed with patient/family including follow up instructions, medication list, d/c activities limitations if indicated, with other d/c instructions as indicated by MD - patient able to verbalize understanding, all questions fully answered.   Patient instructed to return to ED, call 911, or call MD for any changes in condition.   Patient to be escorted via [place], and D/C home via private auto.

## 2022-12-23 NOTE — Progress Notes (Addendum)
4 Days Post-Op   Subjective/Chief Complaint: C/o left chest wall pain but overall feels he is doing better. Reports walking in the hall yesterday using a walker. Tolerating PO. +BM. States his suboxone and PTSD are managed out [facility] [place]. Tells me he would prefer going home over CIR. Wife is at the bedside.  Objective: Vital signs in last 24 hours: Temp:  [98.1 F (36.7 C)-99.5 F (37.5 C)] 98.2 F (36.8 C) (01/29 0745) Pulse Rate:  [66-101] 70 (01/29 0745) Resp:  [15-18] 17 (01/29 0745) BP: (118-139)/(70-74) 126/74 (01/29 0745) SpO2:  [98 %-100 %] 98 % (01/29 0745) Last BM Date : 12/22/22  Intake/Output from previous day: 01/28 0701 - 01/29 0700 In: 200 [P.O.:200] Out: -  Intake/Output this shift: Total I/O In: -  Out: 300 [Urine:300]  General appearance: alert and cooperative Resp: clear to auscultation bilaterally Cardio: regular rate and rhythm GI: soft, nontender  Lab Results:  Recent Labs    12/21/22 0451 12/22/22 0233  WBC 10.9* 8.3  HGB 8.3* 7.9*  HCT 23.9* 23.8*  PLT 152 177   BMET Recent Labs    12/21/22 0451 12/22/22 0233  NA 140 137  K 4.1 4.3  CL 104 103  CO2 29 27  GLUCOSE 122* 92  BUN 17 15  CREATININE 1.43* 1.15  CALCIUM 8.4* 8.4*   PT/INR No results for input(s): "LABPROT", "INR" in the last 72 hours. ABG No results for input(s): "PHART", "HCO3" in the last 72 hours.  Invalid input(s): "PCO2", "PO2"  Studies/Results: No results found.  Anti-infectives: Anti-infectives (From admission, onward)    Start     Dose/Rate Route Frequency Ordered Stop   12/19/22 2200  ceFAZolin (ANCEF) IVPB 2g/100 mL premix        2 g 200 mL/hr over 30 Minutes Intravenous Every 8 hours 12/19/22 1726 12/20/22 1344   12/19/22 1545  vancomycin (VANCOCIN) IVPB 1000 mg/200 mL premix        1,000 mg 200 mL/hr over 60 Minutes Intravenous  Once 12/19/22 1534 12/19/22 1847   12/19/22 1530  vancomycin (VANCOCIN) 1,000 mg in sodium chloride  0.9 % 250 mL IVPB        1,000 mg 250 mL/hr over 60 Minutes Intravenous  Once 12/19/22 1519 12/19/22 1635   12/19/22 1530  vancomycin (VANCOREADY) IVPB 1000 mg/200 mL  Status:  Discontinued        1,000 mg 200 mL/hr over 60 Minutes Intravenous To Surgery 12/19/22 1525 12/19/22 1537       Assessment/Plan: s/p Procedure(s): SACROILIAC SCREW FIXATION OF PELVIS, LEFT TO RIGHT (Left) Advance diet Fall off scaffold 35ft   L rib FX 2-9, with flail segment - pulm toilet and multimodal pain control, CXR 1/26 with infiltrates vs atelectasis, aggressive pulm toilet, IS/flutter/duonebs; add mucinex today to help clear secretions. ORA.  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 L sacrum, iliac and pubic FX with hematoma - OR 1/25 with [name] for B SI screw; TDWB LLE, WBAT RLE FEN - regular diet VTE - LMWH 1/27 >> Dispo - TTF, PT/OT recommend rehab vs home with HHPT/OT if he progresses. Plan for PT/OT today and possible discharge home tomorrow pending progress with therapies.   We discussed that he will need to follow  up at [facility] or with his doctor (Dr. Marvetta Gibbons) in order to continue receiving subutex or suboxone.     LOS: 5 days    Jill Alexanders 12/23/2022

## 2022-12-23 NOTE — Progress Notes (Addendum)
PT Cancellation Note  Patient Details Name: Brent Pittman MRN: [id number] DOB: [birth year]   Cancelled Treatment:    Reason Eval/Treat Not Completed: (P) Fatigue/lethargy limiting ability to participate;Pain limiting ability to participate (Attempted at 10:50 AM, pt recently had pain meds and was fatigued after working with OT, requesting time to rest.) Planned to attempt after 1 hour but was not able due to schedule, will reattempt later in afternoon per PT plan of care as schedule permits.   Catelynn Sparger M Ajna Moors 12/23/2022, 3:12 PM

## 2022-12-23 NOTE — Progress Notes (Signed)
Physical Therapy Treatment Patient Details Name: Brent Pittman MRN: [id number] DOB: 1966-05-21 Today's Date: 12/23/2022   History of Present Illness Pt is a 57 y/o male admitted 1/24 after falling from scaffolding onto his L side from around 7 feet high.  CT/Imaging  showing L rib fx 2-9, spleen Lac, L sacral, iliac and pubic fx, s/p percutaneous sacro-iliac screw fixation, L and R of the posterior pelvic ring.  PMHx: Substance abuse hx (opioid and nicotine).    PT Comments    Pt received supine in bed asleep with family present. Once awake, pt was agreeable to therapy. Pt performed all bed mobility with mod I without cues. Pt demonstrated adherence to LLE TDWB when instructed to perform a hop-to gait pattern with RW. Pt performed gait trial beginning with min guard progressing to supervision. PTA reviewed stair sequence and technique and pt verbalized understanding. Pt and family verbalized and demonstrated understanding of education. Pt and family anticipated to be able to safely care for pt at home.    Recommendations for follow up therapy are one component of a multi-disciplinary discharge planning process, led by the attending physician.  Recommendations may be updated based on patient status, additional functional criteria and insurance authorization.  Follow Up Recommendations  Home health PT     Assistance Recommended at Discharge Frequent or constant Supervision/Assistance  Patient can return home with the following A lot of help with walking and/or transfers;A lot of help with bathing/dressing/bathroom;Assistance with cooking/housework;Assist for transportation;Help with stairs or ramp for entrance;Direct supervision/assist for medications management   Equipment Recommendations  Rolling walker (2 wheels);BSC/3in1;Wheelchair (measurements PT);Wheelchair cushion (measurements PT)       Precautions / Restrictions Precautions Precautions: Fall Restrictions Weight Bearing  Restrictions: Yes LLE Weight Bearing: Touchdown weight bearing     Mobility  Bed Mobility Overal bed mobility: Modified Independent Bed Mobility: Supine to Sit, Sit to Supine     Supine to sit: Modified independent (Device/Increase time) (HOB elevated and use of bedrail) Sit to supine: Modified independent (Device/Increase time) (HOB elevated and use of bedrail)   General bed mobility comments: Pt initiated sitting EOB independently and completed sit to supine without cues.    Transfers Overall transfer level: Needs assistance Equipment used: Rolling walker (2 wheels) Transfers: Sit to/from Stand Sit to Stand: Min guard           General transfer comment: cues for safe UE placement and to place LLE in front to prevent WB to stand    Ambulation/Gait Ambulation/Gait assistance: Min guard, Supervision Gait Distance (Feet): 50 Feet Assistive device: Rolling walker (2 wheels) Gait Pattern/deviations: Step-to pattern       General Gait Details: PTA demonstrated hop-to gait pattern to prevent increased WB on LLE. Pt was able to perform with intermittent TDWB for balance. Pt did not required additional cues to maintain WB status.        Balance Overall balance assessment: Needs assistance Sitting-balance support: No upper extremity supported, Feet supported Sitting balance-Leahy Scale: Fair Sitting balance - Comments: sitting EOB   Standing balance support: Bilateral upper extremity supported Standing balance-Leahy Scale: Fair Standing balance comment: reliant on RW given LLE TWB precs                            Cognition Arousal/Alertness: Awake/alert Behavior During Therapy: WFL for tasks assessed/performed Overall Cognitive Status: Within Functional Limits for tasks assessed  General Comments: Pt with improved recall of precautions this session with increased adherance with WB precautions.         Exercises      General Comments        Pertinent Vitals/Pain Pain Assessment Pain Assessment: 0-10 Pain Score: 6  (Pt reports 6.5/10) Pain Location: L ribs and pelvis Pain Descriptors / Indicators: Grimacing, Guarding, Sharp Pain Intervention(s): Limited activity within patient's tolerance, Monitored during session, Premedicated before session     PT Goals (current goals can now be found in the care plan section) Acute Rehab PT Goals PT Goal Formulation: With patient Time For Goal Achievement: 01/03/23 Progress towards PT goals: Progressing toward goals    Frequency    Min 4X/week      PT Plan Discharge plan needs to be updated       AM-PAC PT "6 Clicks" Mobility   Outcome Measure  Help needed turning from your back to your side while in a flat bed without using bedrails?: A Little Help needed moving from lying on your back to sitting on the side of a flat bed without using bedrails?: A Little Help needed moving to and from a bed to a chair (including a wheelchair)?: A Little Help needed standing up from a chair using your arms (e.g., wheelchair or bedside chair)?: A Little Help needed to walk in hospital room?: A Little Help needed climbing 3-5 steps with a railing? : A Little 6 Click Score: 18    End of Session Equipment Utilized During Treatment: Gait belt Activity Tolerance: Patient tolerated treatment well Patient left: with call bell/phone within reach;with family/visitor present;in bed Nurse Communication: Mobility status;Weight bearing status;Precautions PT Visit Diagnosis: Other abnormalities of gait and mobility (R26.89);Pain Pain - Right/Left: Left     Time: 6073-7106 PT Time Calculation (min) (ACUTE ONLY): 12 min  Charges:  $Gait Training: 8-22 mins                    Michelle Nasuti, PTA Acute Rehabilitation Services Secure Chat Preferred  [phone]    Michelle Nasuti 12/23/2022, 4:08 PM

## 2022-12-24 ENCOUNTER — Other Ambulatory Visit (HOSPITAL_COMMUNITY): Payer: Self-pay

## 2022-12-24 LAB — VITAMIN D 25 HYDROXY (VIT D DEFICIENCY, FRACTURES)

## 2022-12-24 NOTE — Anesthesia Postprocedure Evaluation (Signed)
Anesthesia Post Note  Patient: Brent Pittman  Procedure(s) Performed: SACROILIAC SCREW FIXATION OF PELVIS, LEFT TO RIGHT (Left: Pelvis)     Patient location during evaluation: PACU Anesthesia Type: General Level of consciousness: awake and alert Pain management: pain level controlled Vital Signs Assessment: post-procedure vital signs reviewed and stable Respiratory status: spontaneous breathing, nonlabored ventilation, respiratory function stable and patient connected to nasal cannula oxygen Cardiovascular status: blood pressure returned to baseline and stable Postop Assessment: no apparent nausea or vomiting Anesthetic complications: no  No notable events documented.  Last Vitals:  Vitals:   12/23/22 0501 12/23/22 0745  BP: 118/71 126/74  Pulse: 66 70  Resp: 18 17  Temp: 36.7 C 36.8 C  SpO2: 99% 98%    Last Pain:  Vitals:   12/23/22 0852  TempSrc:   PainSc: 7                  Marily Konczal L Maysin Carstens

## 2022-12-25 DIAGNOSIS — S32811D Multiple fractures of pelvis with unstable disruption of pelvic ring, subsequent encounter for fracture with routine healing: Secondary | ICD-10-CM | POA: Diagnosis not present

## 2022-12-30 ENCOUNTER — Encounter: Payer: Medicaid Other | Admitting: Student

## 2023-01-01 ENCOUNTER — Other Ambulatory Visit (HOSPITAL_COMMUNITY): Payer: Self-pay

## 2023-01-02 NOTE — Discharge Summary (Signed)
Patient ID: Brent Pittman [id number] [birth year] 57 y.o.  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 for B SI screw    Physical Exam: Gen: comfortable, no distress Neuro: non-focal exam HEENT: PERRL Neck: supple CV: RRR Pulm: unlabored breathing Abd: soft, NT GU: clear yellow urine Extr: wwp, no edema   Allergies as of 12/23/2022   No Known Allergies      Medication List     TAKE these medications    Acetaminophen Extra Strength 500 MG Tabs Take 2 tablets (1,000 mg total) by mouth every 6 (six) hours.   buprenorphine-naloxone 8-2 mg Subl SL tablet Commonly known as: SUBOXONE Place 1 tablet under the tongue 3 (three) times daily.   docusate sodium 100 MG capsule Commonly known as: COLACE Take 1 capsule (100 mg total) by mouth 2 (two) times daily.   Eliquis 2.5 MG Tabs tablet Generic drug: apixaban Take 1 tablet (2.5 mg total) by mouth 2 (two) times daily.   gabapentin 800 MG tablet Commonly known as: NEURONTIN Take 800 mg by mouth 2 (two) times daily.   ibuprofen 600 MG tablet Commonly known as: ADVIL Take 1 tablet (600 mg total) by mouth every 6 (six) hours as needed for mild pain or moderate pain.   methocarbamol 500 MG tablet Commonly known as: ROBAXIN Take 2 tablets  (1,000 mg total) by mouth every 8 (eight) hours as needed for muscle spasms.   Mucus Relief DM 30-600 MG Tb12 Take 1 tablet by mouth 2 (two) times daily for 7 days.   oxyCODONE 5 MG immediate release tablet Commonly known as: Oxy IR/ROXICODONE Take 1-2 tablets (5-10 mg total) by mouth every 4 (four) hours as needed for moderate pain or severe pain (5mg  for moderate pain, 10mg  for severe pain).   polyethylene glycol powder 17 GM/SCOOP powder Commonly known as: GLYCOLAX/MIRALAX Take 17 g by mouth daily.   traZODone 50 MG tablet Commonly known as: DESYREL Take 50 mg by mouth at bedtime.   WELLBUTRIN PO          Follow-up Information     [place], MD. Schedule an appointment as soon as possible for a visit in 2 week(s).   Specialty: Orthopedic Surgery Contact information: [place] [address] [place] Follow up.          [facility] Follow up.   Specialty: [place] Why: Now [facility] [phone] Agency will call you to schedule PT/OT appts.        Virl Axe, MD Follow up.   Specialty: Internal Medicine Why: TIME : 10:45 AM DATE: [name] LOCATION : [place] , [address] [place],  [place] [address] Contact information: [place] [address] [phone]         [place]. Go to.   Why: for follow up of splenic injury, call to confirm appointment date/time Contact information: [address] [place] [address] [phone]                 Signed: [name], [place] [facility] 01/02/2023, 8:19 PM

## 2023-01-08 DIAGNOSIS — S32811D Multiple fractures of pelvis with unstable disruption of pelvic ring, subsequent encounter for fracture with routine healing: Secondary | ICD-10-CM | POA: Diagnosis not present

## 2023-01-09 DIAGNOSIS — S32810A Multiple fractures of pelvis with stable disruption of pelvic ring, initial encounter for closed fracture: Secondary | ICD-10-CM | POA: Diagnosis not present

## 2023-01-12 NOTE — Progress Notes (Deleted)
   CC: Establishment of Care  HPI:  [name] is a 57 y.o. with medical history of hx of polysubstance use disorder, MDD presenting to [facility] for hospital follow up. Was in the hospital after a fall where she sustained multiple fractures.   Please see problem-based list for further details, assessments, and plans.  Past Medical History:  Diagnosis Date   Arthritis        Current Outpatient Medications (Analgesics):    acetaminophen (TYLENOL) 500 MG tablet, Take 2 tablets (1,000 mg total) by mouth every 6 (six) hours.   buprenorphine-naloxone (SUBOXONE) 8-2 mg SUBL SL tablet, Place 1 tablet under the tongue 3 (three) times daily.   ibuprofen (ADVIL) 600 MG tablet, Take 1 tablet (600 mg total) by mouth every 6 (six) hours as needed for mild pain or moderate pain.   oxyCODONE (OXY IR/ROXICODONE) 5 MG immediate release tablet, Take 1-2 tablets (5-10 mg total) by mouth every 4 (four) hours as needed for moderate pain or severe pain (64m for moderate pain, 161mfor severe pain).  Current Outpatient Medications (Hematological):    apixaban (ELIQUIS) 2.5 MG TABS tablet, Take 1 tablet (2.5 mg total) by mouth 2 (two) times daily.  Current Outpatient Medications (Other):    buPROPion HCl (WELLBUTRIN PO),    docusate sodium (COLACE) 100 MG capsule, Take 1 capsule (100 mg total) by mouth 2 (two) times daily.   gabapentin (NEURONTIN) 800 MG tablet, Take 800 mg by mouth 2 (two) times daily. (Patient not taking: Reported on 12/19/2022)   methocarbamol (ROBAXIN) 500 MG tablet, Take 2 tablets (1,000 mg total) by mouth every 8 (eight) hours as needed for muscle spasms.   polyethylene glycol powder (GLYCOLAX/MIRALAX) 17 GM/SCOOP powder, Take 17 g by mouth daily.   traZODone (DESYREL) 50 MG tablet, Take 50 mg by mouth at bedtime.  Review of Systems:  Review of system negative unless stated in the problem list or HPI.    Physical Exam:  There were no vitals filed for this visit.  Physical  Exam General: NAD HENT: NCAT Lungs: CTAB, no wheeze, rhonchi or rales.  Cardiovascular: Normal heart sounds, no r/m/g, 2+ pulses in all extremities. No LE edema Abdomen: No TTP, normal bowel sounds MSK: No asymmetry or muscle atrophy.  Skin: no lesions noted on exposed skin Neuro: Alert and oriented x4. CN grossly intact Psych: Normal mood and normal affect   Assessment & Plan:   No problem-specific Assessment & Plan notes found for this encounter.   See Encounters Tab for problem based charting.  Patient discussed with Dr. {NAMES:[id number]::"Brent Pittman","Brent Pittman","Brent Pittman","Brent Pittman","Brent Pittman","Brent Pittman","Brent Pittman","Brent Pittman"} Brent SchullerMD MoTillie RungCoBaylor Emergency Medical Center At Aubreynternal Medicine Residency, PGY-2

## 2023-01-13 ENCOUNTER — Ambulatory Visit: Payer: Medicaid Other | Admitting: Internal Medicine

## 2023-01-31 DIAGNOSIS — I82409 Acute embolism and thrombosis of unspecified deep veins of unspecified lower extremity: Secondary | ICD-10-CM | POA: Diagnosis not present

## 2023-01-31 DIAGNOSIS — M545 Low back pain, unspecified: Secondary | ICD-10-CM | POA: Diagnosis not present

## 2023-01-31 DIAGNOSIS — R609 Edema, unspecified: Secondary | ICD-10-CM | POA: Diagnosis not present

## 2023-01-31 DIAGNOSIS — Z7901 Long term (current) use of anticoagulants: Secondary | ICD-10-CM | POA: Diagnosis not present

## 2023-01-31 DIAGNOSIS — R600 Localized edema: Secondary | ICD-10-CM | POA: Diagnosis not present

## 2023-02-02 DIAGNOSIS — R609 Edema, unspecified: Secondary | ICD-10-CM | POA: Diagnosis not present

## 2023-02-07 DIAGNOSIS — S32810A Multiple fractures of pelvis with stable disruption of pelvic ring, initial encounter for closed fracture: Secondary | ICD-10-CM | POA: Diagnosis not present

## 2023-03-10 DIAGNOSIS — S32810A Multiple fractures of pelvis with stable disruption of pelvic ring, initial encounter for closed fracture: Secondary | ICD-10-CM | POA: Diagnosis not present

## 2023-03-24 NOTE — Progress Notes (Unsigned)
  No show/no call for appt.  Jacky Kindle, FNP  [facility] [phone] (phone) [phone] (fax)  [facility]

## 2023-03-25 ENCOUNTER — Ambulatory Visit (INDEPENDENT_AMBULATORY_CARE_PROVIDER_SITE_OTHER): Payer: Medicaid Other | Admitting: Family Medicine

## 2023-03-25 DIAGNOSIS — Z91199 Patient's noncompliance with other medical treatment and regimen due to unspecified reason: Secondary | ICD-10-CM

## 2023-04-09 DIAGNOSIS — S32810A Multiple fractures of pelvis with stable disruption of pelvic ring, initial encounter for closed fracture: Secondary | ICD-10-CM | POA: Diagnosis not present

## 2023-05-10 DIAGNOSIS — S32810A Multiple fractures of pelvis with stable disruption of pelvic ring, initial encounter for closed fracture: Secondary | ICD-10-CM | POA: Diagnosis not present

## 2024-09-29 ENCOUNTER — Other Ambulatory Visit: Payer: Self-pay | Admitting: Medical Genetics
